# Patient Record
Sex: Female | Born: 1955
Health system: Southern US, Community
[De-identification: ages and names within clinical notes are randomized; demographics above are authoritative.]

## PROBLEM LIST (undated history)

## (undated) DIAGNOSIS — Z8632 Personal history of gestational diabetes: Secondary | ICD-10-CM

## (undated) DIAGNOSIS — Z9289 Personal history of other medical treatment: Secondary | ICD-10-CM

## (undated) DIAGNOSIS — M26629 Arthralgia of temporomandibular joint, unspecified side: Secondary | ICD-10-CM

## (undated) DIAGNOSIS — E041 Nontoxic single thyroid nodule: Secondary | ICD-10-CM

## (undated) DIAGNOSIS — S62102A Fracture of unspecified carpal bone, left wrist, initial encounter for closed fracture: Secondary | ICD-10-CM

## (undated) DIAGNOSIS — D241 Benign neoplasm of right breast: Secondary | ICD-10-CM

## (undated) DIAGNOSIS — O24419 Gestational diabetes mellitus in pregnancy, unspecified control: Secondary | ICD-10-CM

## (undated) DIAGNOSIS — F329 Major depressive disorder, single episode, unspecified: Secondary | ICD-10-CM

## (undated) DIAGNOSIS — E785 Hyperlipidemia, unspecified: Secondary | ICD-10-CM

## (undated) DIAGNOSIS — K219 Gastro-esophageal reflux disease without esophagitis: Secondary | ICD-10-CM

## (undated) DIAGNOSIS — R7303 Prediabetes: Secondary | ICD-10-CM

## (undated) DIAGNOSIS — F430 Acute stress reaction: Secondary | ICD-10-CM

## (undated) DIAGNOSIS — F32A Depression, unspecified: Secondary | ICD-10-CM

## (undated) DIAGNOSIS — I10 Essential (primary) hypertension: Secondary | ICD-10-CM

## (undated) DIAGNOSIS — Z780 Asymptomatic menopausal state: Secondary | ICD-10-CM

## (undated) HISTORY — DX: Asymptomatic menopausal state: Z78.0

## (undated) HISTORY — DX: Gestational diabetes mellitus in pregnancy, unspecified control: O24.419

## (undated) HISTORY — DX: Nontoxic single thyroid nodule: E04.1

## (undated) HISTORY — DX: Essential (primary) hypertension: I10

## (undated) HISTORY — DX: Acute stress reaction: F43.0

## (undated) HISTORY — DX: Benign neoplasm of right breast: D24.1

## (undated) HISTORY — PX: COLONOSCOPY: SHX174

## (undated) HISTORY — DX: Hyperlipidemia, unspecified: E78.5

## (undated) HISTORY — PX: OTHER SURGICAL HISTORY: SHX169

---
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
## 1979-03-09 HISTORY — PX: OTHER SURGICAL HISTORY: SHX169

## 1979-03-09 HISTORY — PX: MICROTUBOPLASTY: SHX5401

## 1985-03-08 HISTORY — PX: TUBAL LIGATION: SHX77

## 1985-03-08 HISTORY — PX: OTHER SURGICAL HISTORY: SHX169

## 1993-03-08 HISTORY — PX: BREAST EXCISIONAL BIOPSY: SUR124

## 1997-09-24 ENCOUNTER — Ambulatory Visit (HOSPITAL_COMMUNITY): Admission: RE | Admit: 1997-09-24 | Discharge: 1997-09-24 | Payer: Self-pay | Admitting: *Deleted

## 1998-09-10 ENCOUNTER — Ambulatory Visit (HOSPITAL_COMMUNITY): Admission: RE | Admit: 1998-09-10 | Discharge: 1998-09-10 | Payer: Self-pay | Admitting: Obstetrics and Gynecology

## 1998-09-10 ENCOUNTER — Encounter: Payer: Self-pay | Admitting: Obstetrics and Gynecology

## 1999-06-11 ENCOUNTER — Other Ambulatory Visit: Admission: RE | Admit: 1999-06-11 | Discharge: 1999-06-11 | Payer: Self-pay | Admitting: Obstetrics and Gynecology

## 1999-10-12 ENCOUNTER — Ambulatory Visit (HOSPITAL_COMMUNITY): Admission: RE | Admit: 1999-10-12 | Discharge: 1999-10-12 | Payer: Self-pay | Admitting: Obstetrics and Gynecology

## 1999-10-12 ENCOUNTER — Encounter: Payer: Self-pay | Admitting: Obstetrics and Gynecology

## 1999-10-28 ENCOUNTER — Encounter: Admission: RE | Admit: 1999-10-28 | Discharge: 1999-10-28 | Payer: Self-pay | Admitting: Family Medicine

## 1999-10-28 ENCOUNTER — Encounter: Payer: Self-pay | Admitting: Family Medicine

## 1999-11-04 ENCOUNTER — Encounter (INDEPENDENT_AMBULATORY_CARE_PROVIDER_SITE_OTHER): Payer: Self-pay | Admitting: *Deleted

## 1999-11-04 ENCOUNTER — Encounter: Payer: Self-pay | Admitting: Family Medicine

## 1999-11-04 ENCOUNTER — Ambulatory Visit (HOSPITAL_COMMUNITY): Admission: RE | Admit: 1999-11-04 | Discharge: 1999-11-04 | Payer: Self-pay | Admitting: Cardiology

## 2000-05-11 ENCOUNTER — Encounter: Payer: Self-pay | Admitting: Family Medicine

## 2000-05-11 ENCOUNTER — Encounter: Admission: RE | Admit: 2000-05-11 | Discharge: 2000-05-11 | Payer: Self-pay | Admitting: Family Medicine

## 2000-10-17 ENCOUNTER — Encounter: Payer: Self-pay | Admitting: Obstetrics and Gynecology

## 2000-10-17 ENCOUNTER — Ambulatory Visit (HOSPITAL_COMMUNITY): Admission: RE | Admit: 2000-10-17 | Discharge: 2000-10-17 | Payer: Self-pay | Admitting: Obstetrics and Gynecology

## 2001-06-05 ENCOUNTER — Encounter: Payer: Self-pay | Admitting: Family Medicine

## 2001-06-05 ENCOUNTER — Encounter: Admission: RE | Admit: 2001-06-05 | Discharge: 2001-06-05 | Payer: Self-pay | Admitting: Family Medicine

## 2001-12-06 ENCOUNTER — Ambulatory Visit (HOSPITAL_COMMUNITY): Admission: RE | Admit: 2001-12-06 | Discharge: 2001-12-06 | Payer: Self-pay | Admitting: Obstetrics and Gynecology

## 2001-12-06 ENCOUNTER — Encounter: Payer: Self-pay | Admitting: Obstetrics and Gynecology

## 2002-12-10 ENCOUNTER — Ambulatory Visit (HOSPITAL_COMMUNITY): Admission: RE | Admit: 2002-12-10 | Discharge: 2002-12-10 | Payer: Self-pay | Admitting: Obstetrics and Gynecology

## 2002-12-10 ENCOUNTER — Encounter: Payer: Self-pay | Admitting: Obstetrics and Gynecology

## 2003-12-19 ENCOUNTER — Ambulatory Visit (HOSPITAL_COMMUNITY): Admission: RE | Admit: 2003-12-19 | Discharge: 2003-12-19 | Payer: Self-pay | Admitting: Obstetrics and Gynecology

## 2004-10-06 ENCOUNTER — Other Ambulatory Visit: Admission: RE | Admit: 2004-10-06 | Discharge: 2004-10-06 | Payer: Self-pay | Admitting: Obstetrics and Gynecology

## 2004-10-09 ENCOUNTER — Encounter: Admission: RE | Admit: 2004-10-09 | Discharge: 2004-10-09 | Payer: Self-pay | Admitting: Obstetrics and Gynecology

## 2005-03-08 HISTORY — PX: COLONOSCOPY: SHX174

## 2005-08-12 ENCOUNTER — Other Ambulatory Visit: Admission: RE | Admit: 2005-08-12 | Discharge: 2005-08-12 | Payer: Self-pay | Admitting: *Deleted

## 2005-08-30 ENCOUNTER — Encounter: Admission: RE | Admit: 2005-08-30 | Discharge: 2005-08-30 | Payer: Self-pay | Admitting: Family Medicine

## 2005-10-12 ENCOUNTER — Encounter: Admission: RE | Admit: 2005-10-12 | Discharge: 2005-11-18 | Payer: Self-pay | Admitting: Occupational Medicine

## 2005-11-01 ENCOUNTER — Ambulatory Visit (HOSPITAL_COMMUNITY): Admission: RE | Admit: 2005-11-01 | Discharge: 2005-11-01 | Payer: Self-pay | Admitting: Gastroenterology

## 2005-12-13 ENCOUNTER — Encounter: Admission: RE | Admit: 2005-12-13 | Discharge: 2005-12-13 | Payer: Self-pay | Admitting: Family Medicine

## 2006-10-18 ENCOUNTER — Other Ambulatory Visit: Admission: RE | Admit: 2006-10-18 | Discharge: 2006-10-18 | Payer: Self-pay | Admitting: Family Medicine

## 2006-11-23 ENCOUNTER — Ambulatory Visit (HOSPITAL_COMMUNITY): Admission: RE | Admit: 2006-11-23 | Discharge: 2006-11-23 | Payer: Self-pay | Admitting: Interventional Cardiology

## 2006-12-16 ENCOUNTER — Ambulatory Visit (HOSPITAL_COMMUNITY): Admission: RE | Admit: 2006-12-16 | Discharge: 2006-12-16 | Payer: Self-pay | Admitting: Family Medicine

## 2007-01-09 ENCOUNTER — Emergency Department (HOSPITAL_COMMUNITY): Admission: EM | Admit: 2007-01-09 | Discharge: 2007-01-09 | Payer: Self-pay | Admitting: Emergency Medicine

## 2007-06-29 ENCOUNTER — Encounter: Admission: RE | Admit: 2007-06-29 | Discharge: 2007-06-29 | Payer: Self-pay | Admitting: Family Medicine

## 2007-07-07 ENCOUNTER — Ambulatory Visit (HOSPITAL_BASED_OUTPATIENT_CLINIC_OR_DEPARTMENT_OTHER): Admission: RE | Admit: 2007-07-07 | Discharge: 2007-07-07 | Payer: Self-pay | Admitting: Family Medicine

## 2007-07-15 ENCOUNTER — Ambulatory Visit: Payer: Self-pay | Admitting: Internal Medicine

## 2007-09-18 ENCOUNTER — Emergency Department (HOSPITAL_COMMUNITY): Admission: EM | Admit: 2007-09-18 | Discharge: 2007-09-18 | Payer: Self-pay | Admitting: Emergency Medicine

## 2007-12-18 ENCOUNTER — Ambulatory Visit (HOSPITAL_COMMUNITY): Admission: RE | Admit: 2007-12-18 | Discharge: 2007-12-18 | Payer: Self-pay | Admitting: Family Medicine

## 2008-02-12 ENCOUNTER — Emergency Department (HOSPITAL_COMMUNITY): Admission: EM | Admit: 2008-02-12 | Discharge: 2008-02-12 | Payer: Self-pay | Admitting: Family Medicine

## 2008-04-30 ENCOUNTER — Emergency Department (HOSPITAL_COMMUNITY): Admission: EM | Admit: 2008-04-30 | Discharge: 2008-04-30 | Payer: Self-pay | Admitting: Family Medicine

## 2008-10-21 ENCOUNTER — Emergency Department (HOSPITAL_COMMUNITY): Admission: EM | Admit: 2008-10-21 | Discharge: 2008-10-21 | Payer: Self-pay | Admitting: Family Medicine

## 2008-12-17 ENCOUNTER — Emergency Department (HOSPITAL_COMMUNITY): Admission: EM | Admit: 2008-12-17 | Discharge: 2008-12-17 | Payer: Self-pay | Admitting: Emergency Medicine

## 2008-12-27 ENCOUNTER — Other Ambulatory Visit: Admission: RE | Admit: 2008-12-27 | Discharge: 2008-12-27 | Payer: Self-pay | Admitting: Family Medicine

## 2009-08-13 ENCOUNTER — Encounter: Admission: RE | Admit: 2009-08-13 | Discharge: 2009-08-13 | Payer: Self-pay | Admitting: Family Medicine

## 2010-01-21 ENCOUNTER — Ambulatory Visit (HOSPITAL_COMMUNITY): Admission: RE | Admit: 2010-01-21 | Discharge: 2010-01-21 | Payer: Self-pay | Admitting: Family Medicine

## 2010-03-29 ENCOUNTER — Encounter: Payer: Self-pay | Admitting: Family Medicine

## 2010-06-23 LAB — POCT URINALYSIS DIP (DEVICE)
Nitrite: NEGATIVE
Urobilinogen, UA: 0.2 mg/dL (ref 0.0–1.0)
pH: 7 (ref 5.0–8.0)

## 2010-07-24 NOTE — Op Note (Signed)
NAMEBREDA, Lori Walsh                ACCOUNT NO.:  [id number]   MEDICAL RECORD NO.:  [id number]          PATIENT TYPE:  AMB   LOCATION:  ENDO                         FACILITY:  [facility]   PHYSICIAN:  Shirley Friar, MDDATE OF BIRTH:  1955-09-08   DATE OF PROCEDURE:  DATE OF DISCHARGE:                                 OPERATIVE REPORT   PROCEDURE:  Colonoscopy.   INDICATION:  Screening (family history of colon polyps).   FINDINGS:  Rectal exam was normal.  A pediatric adjustable colonoscope was  inserted into an adequately prepped colon and advanced to the cecum where  the ileocecal valve and appendiceal orifice were identified.  On careful  withdrawal, the colonoscope revealed no mucosal abnormalities.  Retroflexion  was unremarkable.   ASSESSMENT:  Normal colonoscopy.   PLAN:  Repeat colonoscopy in 5 years due to family history of polyps.      Shirley Friar, MD  Electronically Signed     VCS/MEDQ  D:  11/01/2005  T:  11/01/2005  Job:  [id number]   cc:   Caryn Bee L. Little, M.D.

## 2010-07-24 NOTE — Procedures (Signed)
NAME:  Lori Walsh, Lori Walsh                ACCOUNT NO.:  [id number]   MEDICAL RECORD NO.:  [id number]          PATIENT TYPE:  OUT   LOCATION:  SLEEP CENTER                 FACILITY:  [facility]   PHYSICIAN:  Clinton D. Maple Hudson, MD, FCCP, FACPDATE OF BIRTH:  01[birth year]   DATE OF STUDY:                            NOCTURNAL POLYSOMNOGRAM   REFERRING PHYSICIAN:  Chales Salmon. Abigail Miyamoto, M.D.   INDICATION FOR STUDY:  Hypersomnia with sleep apnea.   EPWORTH SLEEPINESS SCORE:  10/24, BMI 29, weight 169 pounds, height 64  inches, neck 14.5 inches.   MEDICATIONS:  Home medications charted and reviewed.   SLEEP ARCHITECTURE:  Total sleep time 363 minutes with sleep efficiency  93%.  Stage I was 5.9%.  Stage II 70.4%.  Stage III 2.2%.  REM 21.5% of  total sleep time.  Sleep latency 10.5 minutes.  REM latency 158 minutes.  Awake after sleep onset 14.5 minutes.  Arousal index 10.2.  Tylenol PM  was taken at 9:45 p.m.   RESPIRATORY DATA:  Apnea/hypopnea index (AHI) 5.8 per hour indicating  minimal obstructive sleep apnea/hypopnea syndrome, 35 events were  counted including 2 obstructive apneas and 33 hypopneas.  Events were  not positional.  REM AHI 10 per hour.  There were insufficient events to  qualify for CPAP titration by split protocol on this study night.   OXYGEN DATA:  Loud snoring with oxygen desaturation to a nadir of 89%.  Mean oxygen saturation through the study 96.8% on room air.   CARDIAC DATA:  Normal sinus rhythm.   MOVEMENT-PARASOMNIA:  No significant movement disturbance.  No bathroom  trips.   IMPRESSIONS-RECOMMENDATIONS:  1. Minimal obstructive sleep apnea/hypopnea syndrome, AHI 5.8 per hour      (normal range 0-5 per hour).  Nonpositional events with very loud      snoring and oxygen desaturation to a nadir of 89%.  2. There were insufficient events to qualify for continuous positive      airway pressure titration by split protocol on      this study night.  Conservative therapy may  include weight loss,      encouragement to sleep off flat of back, treatment for any      significant nasal or upper airway obstruction and possibly an oral      appliance.      Clinton D. Maple Hudson, MD, [facility], FACP  Diplomate, Biomedical engineer of Sleep Medicine  Electronically Signed     CDY/MEDQ  D:  07/15/2007 09:01:35  T:  07/15/2007 09:21:50  Job:  [id number]

## 2010-09-25 ENCOUNTER — Other Ambulatory Visit (HOSPITAL_COMMUNITY)
Admission: RE | Admit: 2010-09-25 | Discharge: 2010-09-25 | Disposition: A | Discharge status: Home / Self Care | Payer: 59 | Source: Ambulatory Visit | Attending: Family Medicine | Admitting: Family Medicine

## 2010-09-25 ENCOUNTER — Other Ambulatory Visit: Payer: Self-pay | Admitting: Family Medicine

## 2010-09-25 DIAGNOSIS — Z124 Encounter for screening for malignant neoplasm of cervix: Secondary | ICD-10-CM | POA: Insufficient documentation

## 2010-09-25 DIAGNOSIS — Z1159 Encounter for screening for other viral diseases: Secondary | ICD-10-CM | POA: Insufficient documentation

## 2010-12-15 LAB — POCT URINALYSIS DIP (DEVICE)
Nitrite: POSITIVE — AB
Urobilinogen, UA: 0.2
pH: 6

## 2011-02-08 ENCOUNTER — Other Ambulatory Visit: Payer: Self-pay | Admitting: Gastroenterology

## 2011-02-23 ENCOUNTER — Other Ambulatory Visit (HOSPITAL_COMMUNITY): Payer: Self-pay | Admitting: Obstetrics & Gynecology

## 2011-02-23 DIAGNOSIS — Z1231 Encounter for screening mammogram for malignant neoplasm of breast: Secondary | ICD-10-CM

## 2011-03-29 ENCOUNTER — Ambulatory Visit (HOSPITAL_COMMUNITY): Payer: 59 | Attending: Obstetrics & Gynecology

## 2011-04-05 ENCOUNTER — Other Ambulatory Visit: Payer: Self-pay | Admitting: Family Medicine

## 2011-04-05 DIAGNOSIS — E041 Nontoxic single thyroid nodule: Secondary | ICD-10-CM

## 2011-08-16 ENCOUNTER — Ambulatory Visit
Admission: RE | Admit: 2011-08-16 | Discharge: 2011-08-16 | Disposition: A | Discharge status: Home / Self Care | Payer: 59 | Source: Ambulatory Visit | Attending: Family Medicine | Admitting: Family Medicine

## 2011-08-16 DIAGNOSIS — E041 Nontoxic single thyroid nodule: Secondary | ICD-10-CM

## 2012-03-09 ENCOUNTER — Ambulatory Visit (HOSPITAL_COMMUNITY): Payer: 59 | Attending: Obstetrics & Gynecology

## 2012-03-13 ENCOUNTER — Other Ambulatory Visit (HOSPITAL_COMMUNITY): Payer: Self-pay | Admitting: Family Medicine

## 2012-03-13 DIAGNOSIS — Z1231 Encounter for screening mammogram for malignant neoplasm of breast: Secondary | ICD-10-CM

## 2012-03-15 ENCOUNTER — Ambulatory Visit (HOSPITAL_COMMUNITY)
Admission: RE | Admit: 2012-03-15 | Discharge: 2012-03-15 | Disposition: A | Discharge status: Home / Self Care | Payer: 59 | Source: Ambulatory Visit | Attending: Family Medicine | Admitting: Family Medicine

## 2012-03-15 DIAGNOSIS — Z1231 Encounter for screening mammogram for malignant neoplasm of breast: Secondary | ICD-10-CM | POA: Insufficient documentation

## 2012-07-12 ENCOUNTER — Ambulatory Visit (INDEPENDENT_AMBULATORY_CARE_PROVIDER_SITE_OTHER): Payer: Self-pay | Admitting: Family Medicine

## 2012-07-12 VITALS — BP 128/78 | Wt 187.0 lb

## 2012-07-12 DIAGNOSIS — R7303 Prediabetes: Secondary | ICD-10-CM

## 2012-07-12 DIAGNOSIS — R7309 Other abnormal glucose: Secondary | ICD-10-CM

## 2012-07-13 DIAGNOSIS — E119 Type 2 diabetes mellitus without complications: Secondary | ICD-10-CM | POA: Insufficient documentation

## 2012-07-13 DIAGNOSIS — R7303 Prediabetes: Secondary | ICD-10-CM | POA: Insufficient documentation

## 2012-07-13 NOTE — Progress Notes (Signed)
Patient presents today for 3 month pre-diabetes follow-up as part of the employer-sponsored Link to Wellness program. Patient is not currently treated for diabetes. Patient also continues on daily ASA and ACEi. Most recent MD follow-up was this past February. Patient will follow-up again with MD in 6 months. No med changes or major health changes at this time.   Diabetes Assessment: Type of Diabetes: Pre-Diabetes; MD managing Diabetes [place], eagle at [employer]; Sees Diabetes provider 2 times per year; checks blood glucose 1-2 times a week; takes medications as prescribed; checks feet weekly; takes an aspirin a day; uses glucometer; hypoglycemia frequency None; Highest CBG 124; A1c - 6.0 via point-of-care.  Other Diabetes History: Patient is remains pre-diabetic and is not currently being treated. She continues testing 1-2 times per week with no hypoglycemic episodes. Patient did not bring meter today but per patient recall highest readings has been 124. Patient is up-to-date on eye exam and denies any issues with feet.   Lifestyle Factors: Exercise - No routine exercise at this time. Patient is attempting to walk 1-2 times per week on lunch breaks and continues walking dog nearly daily. Has not been to the gym recently but still has a Research scientist (physical sciences). Limiting factor at this time is stress and fatigue. Patient is aware that exercise will help relieve stress and improve mood, but is struggling with motivation at this time. She is currently under a very stressful family situation, given that she and her husband have opened their home to other family members.   Diet - Patient admits that diet has deteriorated due to stress eating. She is aware her portions have increased and she is eating more icecream and comfort foods. As a result she has gained 10 lbs and recognizes the importance of getting back on track.  Assessment: Patient presents today with slightly elevated A1c of 6.0, previously 5.7. This is  likely due to dietary deterioration, stress, and weight gain of 10 lbs. Patient is aware of the need to improve and will work on lifestyle changes in the coming months. She will follow-up in 3 months.  Plan: 1) Continue testing regularly 2) Attempt to increase exercise as tolerated and as time allows 3) Attempt to get back on track with diet, limiting portion sizes and sweets 4) Follow-up in 3 months on Wednesday August 6th @ 11:00 am

## 2012-07-20 NOTE — Progress Notes (Signed)
Patient ID: SRISHTI STRNAD, female   DOB: May 13, 1955, 57 y.o.   MRN: [id number] ATTENDING PHYSICIAN NOTE: I have reviewed the chart and agree with the plan as detailed above. Denny Levy MD Pager [phone]

## 2012-10-09 ENCOUNTER — Ambulatory Visit: Payer: 59 | Attending: Orthopedic Surgery | Admitting: Physical Therapy

## 2012-10-09 DIAGNOSIS — M25619 Stiffness of unspecified shoulder, not elsewhere classified: Secondary | ICD-10-CM | POA: Insufficient documentation

## 2012-10-09 DIAGNOSIS — R293 Abnormal posture: Secondary | ICD-10-CM | POA: Insufficient documentation

## 2012-10-09 DIAGNOSIS — IMO0001 Reserved for inherently not codable concepts without codable children: Secondary | ICD-10-CM | POA: Insufficient documentation

## 2012-10-09 DIAGNOSIS — M25519 Pain in unspecified shoulder: Secondary | ICD-10-CM | POA: Insufficient documentation

## 2012-10-11 ENCOUNTER — Ambulatory Visit: Payer: 59 | Admitting: Physical Therapy

## 2012-10-17 ENCOUNTER — Ambulatory Visit: Payer: 59 | Admitting: Rehabilitation

## 2012-10-19 ENCOUNTER — Ambulatory Visit: Payer: 59 | Admitting: Rehabilitation

## 2012-10-24 ENCOUNTER — Ambulatory Visit: Payer: 59 | Admitting: Physical Therapy

## 2012-10-26 ENCOUNTER — Ambulatory Visit: Payer: 59 | Admitting: Rehabilitation

## 2012-10-30 ENCOUNTER — Ambulatory Visit: Payer: 59 | Admitting: Physical Therapy

## 2012-11-01 ENCOUNTER — Ambulatory Visit: Payer: 59 | Admitting: Physical Therapy

## 2012-11-07 ENCOUNTER — Encounter: Payer: 59 | Admitting: Physical Therapy

## 2012-11-08 ENCOUNTER — Ambulatory Visit (INDEPENDENT_AMBULATORY_CARE_PROVIDER_SITE_OTHER): Payer: Self-pay | Admitting: Family Medicine

## 2012-11-08 ENCOUNTER — Ambulatory Visit: Payer: 59 | Admitting: Rehabilitation

## 2012-11-08 VITALS — BP 132/78 | Wt 186.0 lb

## 2012-11-08 DIAGNOSIS — R7309 Other abnormal glucose: Secondary | ICD-10-CM

## 2012-11-08 DIAGNOSIS — R7303 Prediabetes: Secondary | ICD-10-CM

## 2012-11-08 NOTE — Progress Notes (Signed)
Patient presents today for 3 month pre-diabetes follow-up as part of the employer-sponsored Link to Wellness program. Patient is not currently being treated for pre-diabets. She does continue on daily ASA and ACEi. Patient has not seen MD recently and has a follow-up yearly unless otherwise needed. No med changes or major health changes at this time. She is on high dose IBU temporarily for heel spur.   Diabetes Assessment: Type of Diabetes: Pre-Diabetes; MD managing Diabetes [place], eagle at [employer]; Sees Diabetes provider 2 times per year; checks blood glucose 1-2 times a week; takes medications as prescribed; takes an aspirin a day; uses glucometer; hypoglycemia frequency None; Highest CBG 124; A1c 5.8 via point-of-care in pharmacy Other Diabetes History: Patient is remains pre-diabetic and is not currently being treated. She continues testing 1-2 times per week with no hypoglycemic episodes. Patient did not bring meter today but per patient recall highest readings has been 124, random, and most fasting readings ranging 105-110s. Patient is up-to-date on eye exam and denies any issues with feet.   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 The club meets once weekly and walks for ~40 min. In addition to this, patient walks one day per week for 30 minutes. This is an improvement from last follow-up as patient was walking only on occassion during her lunch break.  Diet - Patient admits that she continues struggling with diet. She continues to be in a somewhat stressful home environment with multiple family members having moved in with her. Her daughter-in-law now cooks for her, which is very kind, but patient reports her cooking habits are less than optimal, with macaroni and cheese, potatoes, or corn on the cob nearly every night. She also continues eating comfort foods such as icecream on a regular basis. She will attempt to fix  herself a salad as a substitute for macaroni and cheese every night.   Assessment: Patient presents today with slightly elevated A1c of 5.8, previously 5.7. This is likely due to continued dietary deterioration. She has not lost weight at this visit, but will work on this over the next three months. Patient is aware of the need to improve and will work on lifestyle changes in the coming months. At this time we will focus on imrpoving dietary choices and continuing with twice per week exercise regimen.   Plan: 1) Focus on diet over the next three months 2) Attempt to limit starchy carbs, and portion sizes 3) Continue in walking school, great job with this 4) Return for follow-up in 3 mo follow-up on Wednesday Dec 3rd @ 1:30 pm

## 2012-11-09 ENCOUNTER — Ambulatory Visit: Payer: 59 | Attending: Orthopedic Surgery | Admitting: Rehabilitation

## 2012-11-09 ENCOUNTER — Ambulatory Visit: Payer: 59 | Admitting: Rehabilitation

## 2012-11-09 DIAGNOSIS — IMO0001 Reserved for inherently not codable concepts without codable children: Secondary | ICD-10-CM | POA: Insufficient documentation

## 2012-11-09 DIAGNOSIS — M25619 Stiffness of unspecified shoulder, not elsewhere classified: Secondary | ICD-10-CM | POA: Insufficient documentation

## 2012-11-09 DIAGNOSIS — R293 Abnormal posture: Secondary | ICD-10-CM | POA: Insufficient documentation

## 2012-11-09 DIAGNOSIS — M25519 Pain in unspecified shoulder: Secondary | ICD-10-CM | POA: Insufficient documentation

## 2012-11-14 ENCOUNTER — Ambulatory Visit: Payer: 59 | Admitting: Physical Therapy

## 2012-11-17 ENCOUNTER — Ambulatory Visit: Payer: 59 | Admitting: Rehabilitation

## 2013-04-04 ENCOUNTER — Ambulatory Visit (INDEPENDENT_AMBULATORY_CARE_PROVIDER_SITE_OTHER): Payer: Self-pay | Admitting: Family Medicine

## 2013-04-04 VITALS — BP 128/78 | Wt 182.0 lb

## 2013-04-04 DIAGNOSIS — R7303 Prediabetes: Secondary | ICD-10-CM

## 2013-04-04 DIAGNOSIS — R7309 Other abnormal glucose: Secondary | ICD-10-CM

## 2013-04-10 NOTE — Progress Notes (Signed)
Patient presents today for 3 month pre-diabetes follow-up as part of the employer-sponsored Link to Wellness program. Patient is not currently being treated for pre-diabetes. Patient continues on daily ASA, ACEi. She is not on a statin but does take Red yeast rice (her choice). Most recent MD follow-up was Oct 2014. Patient has a pending appt for February for routine physical and labwork. No med changes or major health changes at this time.  Diabetes Assessment: Type of Diabetes: Pre-Diabetes; MD managing Diabetes [place], eagle at [employer]; Sees Diabetes provider 2 times per year; checks blood glucose 1-2 times a week; takes medications as prescribed; checks feet weekly; takes an aspirin a day; uses glucometer; hypoglycemia frequency None; 7 day CBG average 106; 30 day CBG average 118; 14 day CBG average 117; Lowest CBG 90; Highest CBG 154; A1c 5.7 Other Diabetes History: Patient is remains pre-diabetic and is not currently being treated. She continues testing 1-2 times per week with no hypoglycemic episodes. Patient did bring meter today and glucose is generally between 90-120s with lowest reading of 90 and highest reading of 154. Patient is due for eye exam and will schedule appt soon. Denies any issues with feet.   Lifestyle Factors: Exercise - Patient is exercising but not as routinely as she would like. She is walking around the hospital for ~15 minutes during her lunch break. She also occassionally walks in the morning depending on weather. She and her husband now have a gym membership and she attends approximately once weekly for 60 minutes, but would like to increase to twice weekly. While at the gym patient uses stationary bike and does 30 minutes of circuit training exercises.  Diet - Patient reports an improved diet, as her daughter-in-law is no longer in charge of the cooking, as a result they are making better choices. Patient does admit to continued snacking at night after supper,  specifically on icecream. However, she has attempted to making improvements in this area with regard to portion sizes and frequency of snacking. Patient has a weight loss goal of 15 lbs over next three months.   Assessment: Patient presents today with at goal A1c of 5.7. Patient has made some improvement to diet and hopes to continue to increase exercise. She has set a goal to lose 15 lbs over the next three months. Patient is aware of the need to improve in order to meet this goal and will work on lifestyle changes, specifically exercise, in the coming months. Patient will follow-up in 3 months.  Plan: 1) Increase exercise, attempt to go to gym twice per week along with other exercising 2) Increase vegetables by adding a salad for lunch once per week 3) Weight loss goal of 15 lbs over next three months 4) Schedule an eye exam and mammogram 5) Follow-up in 3 months

## 2013-05-07 ENCOUNTER — Other Ambulatory Visit (HOSPITAL_COMMUNITY): Payer: Self-pay | Admitting: Family Medicine

## 2013-05-07 DIAGNOSIS — Z1231 Encounter for screening mammogram for malignant neoplasm of breast: Secondary | ICD-10-CM

## 2013-05-10 ENCOUNTER — Ambulatory Visit (HOSPITAL_COMMUNITY): Payer: 59

## 2013-05-21 ENCOUNTER — Ambulatory Visit (HOSPITAL_COMMUNITY)
Admission: RE | Admit: 2013-05-21 | Discharge: 2013-05-21 | Disposition: A | Discharge status: Home / Self Care | Payer: 59 | Source: Ambulatory Visit | Attending: Family Medicine | Admitting: Family Medicine

## 2013-05-21 DIAGNOSIS — Z1231 Encounter for screening mammogram for malignant neoplasm of breast: Secondary | ICD-10-CM | POA: Insufficient documentation

## 2013-07-03 ENCOUNTER — Other Ambulatory Visit (HOSPITAL_COMMUNITY)
Admission: RE | Admit: 2013-07-03 | Discharge: 2013-07-03 | Disposition: A | Discharge status: Home / Self Care | Payer: 59 | Source: Ambulatory Visit | Attending: Family Medicine | Admitting: Family Medicine

## 2013-07-03 ENCOUNTER — Other Ambulatory Visit: Payer: Self-pay | Admitting: Family Medicine

## 2013-07-03 DIAGNOSIS — Z1151 Encounter for screening for human papillomavirus (HPV): Secondary | ICD-10-CM | POA: Insufficient documentation

## 2013-07-03 DIAGNOSIS — Z124 Encounter for screening for malignant neoplasm of cervix: Secondary | ICD-10-CM | POA: Insufficient documentation

## 2013-07-04 ENCOUNTER — Other Ambulatory Visit: Payer: Self-pay | Admitting: Family Medicine

## 2013-07-04 ENCOUNTER — Other Ambulatory Visit (HOSPITAL_COMMUNITY): Payer: Self-pay | Admitting: Family Medicine

## 2013-07-04 DIAGNOSIS — E041 Nontoxic single thyroid nodule: Secondary | ICD-10-CM

## 2013-07-24 ENCOUNTER — Ambulatory Visit (INDEPENDENT_AMBULATORY_CARE_PROVIDER_SITE_OTHER): Payer: Self-pay | Admitting: Family Medicine

## 2013-07-24 VITALS — BP 132/70 | Wt 178.0 lb

## 2013-07-24 DIAGNOSIS — E119 Type 2 diabetes mellitus without complications: Secondary | ICD-10-CM

## 2013-07-24 NOTE — Progress Notes (Signed)
Patient presents today for 3 month pre-diabetes follow-up as part of the employer-sponsored Link to Wellness program. Pt is not currently being treated for diabetes. Patient also continues on daily ASA, ACEi, and red yeast rice. Most recent MD follow-up was April 2014. Patient has a pending appt for 6 mofollow-up. No med changes or major health changes at this time.  Diabetes Assessment: Type of Diabetes: Pre-Diabetes; MD managing Diabetes [place], eagle at [employer]; Sees Diabetes provider 2 times per year; checks blood glucose 1-2 times a week; takes medications as prescribed; checks feet weekly; takes an aspirin a day; uses glucometer; hypoglycemia frequency None; Highest CBG 105; Lowest CBG 93; A1c 5.9 (prev 5.7) Other Diabetes History: Patient is remains pre-diabetic and is not currently being treated. She continues testing 1-2 times per week with no hypoglycemic episodes. Patient did not bring meter today but per pt report, glucose with lowest reading of 93 and highest reading of 105 . Patient is up to date on eye exam. Denies any issues with feet or neuropathy.   Lifestyle Factors: Exercise -Patient continues exercising regularly. She continues walking around the hospital for ~15 minutes during her lunch break. She also occassionally walks her dog in the morning depending on weather. She and her husband still have a gym membership and she now participates in the "Diabetes Prevention" program through [facility]. [facility] employees receive a discounted rate. The program serves as an exercise program (weekly x 16 weeks) as well as a support group who discusses diet, weight loss, etc.  Diet - No major changes to diet, pateint continues attempting to make healthy choices and manage portion sizes. She is trying to avoid purchasing snack items, but does admit to continuing to eat icecream regularly. Patient has a continued weight loss goal of 15 lbs over next three months, so far she has lost 4 lbs toward this  goal.   Assessment: Patient presents today with slightly elevated A1c of 5.9 (prev 5.7). Patient continues to make some improvement to diet and hopes to continue to increase exercise. She has set a goal to lose 15 lbs over the next three months. Patient is aware of the need to improve in order to meet this goal and will work on lifestyle changes, specifically exercise, in the coming months. Patient will follow-up in 3 months.  Plan: 1) Continue making healthy dietary choices 2) Continue exercising regularly, goal of 150 min/week, with weight loss goal of 15 lb total 3) Continue testing regularly 4) Return for follow-up in 3 months on Wednesday, August 19th @ 11:00

## 2013-08-06 ENCOUNTER — Ambulatory Visit (HOSPITAL_COMMUNITY)
Admission: RE | Admit: 2013-08-06 | Discharge: 2013-08-06 | Disposition: A | Discharge status: Home / Self Care | Payer: 59 | Source: Ambulatory Visit | Attending: Family Medicine | Admitting: Family Medicine

## 2013-08-06 DIAGNOSIS — E041 Nontoxic single thyroid nodule: Secondary | ICD-10-CM | POA: Insufficient documentation

## 2013-08-07 ENCOUNTER — Other Ambulatory Visit (HOSPITAL_COMMUNITY): Payer: Self-pay | Admitting: Family Medicine

## 2013-08-07 DIAGNOSIS — E041 Nontoxic single thyroid nodule: Secondary | ICD-10-CM

## 2013-08-10 ENCOUNTER — Encounter (HOSPITAL_COMMUNITY): Payer: Self-pay | Admitting: Pharmacy Technician

## 2013-08-14 ENCOUNTER — Ambulatory Visit (HOSPITAL_COMMUNITY)
Admission: RE | Admit: 2013-08-14 | Discharge: 2013-08-14 | Disposition: A | Discharge status: Home / Self Care | Payer: 59 | Source: Ambulatory Visit | Attending: Family Medicine | Admitting: Family Medicine

## 2013-08-14 DIAGNOSIS — E041 Nontoxic single thyroid nodule: Secondary | ICD-10-CM | POA: Insufficient documentation

## 2013-08-14 NOTE — Procedures (Signed)
US thyroid nodule (L) Enlarging nodule Left lower pole  25 g x 4  Pt tolerated well Path pending

## 2013-08-29 ENCOUNTER — Encounter: Payer: Self-pay | Admitting: Family Medicine

## 2013-10-24 ENCOUNTER — Ambulatory Visit (INDEPENDENT_AMBULATORY_CARE_PROVIDER_SITE_OTHER): Payer: Self-pay | Admitting: Family Medicine

## 2013-10-24 VITALS — BP 132/78 | Wt 177.0 lb

## 2013-10-24 DIAGNOSIS — R7309 Other abnormal glucose: Secondary | ICD-10-CM

## 2013-10-24 DIAGNOSIS — R7303 Prediabetes: Secondary | ICD-10-CM

## 2013-10-25 NOTE — Progress Notes (Signed)
Patient presents today for 3 month pre-diabetes follow-up as part of the employer-sponsored Link to Wellness program. Pt is not currently being treated for diabetes. Patient also continues on daily ASA, ACEi, and red yeast rice. Most recent MD follow-up was April 2014. Patient has a pending appt for 6 mo follow-up. No med changes or major health changes at this time. Of note, patient has been followed closely for several years for thyroid nodules. She recently had thyroid scanned and aspirated and will see an endocrinologist in October for evaluation of new nodule.  Diabetes Assessment: Type of Diabetes: Pre-Diabetes; MD managing Diabetes [place], eagle at [employer]; Sees Diabetes provider 2 times per year; checks blood glucose 1-2 times a week; takes medications as prescribed; checks feet weekly; takes an aspirin a day; uses glucometer; hypoglycemia frequency None; 14 day CBG average 122; 30 day CBG average 114; 7 day CBG average 127; Highest CBG 167; Lowest CBG 91 Other Diabetes History: Patient is remains pre-diabetic and is not currently being treated. She continues testing 1-2 times per week with no hypoglycemic episodes. Patient did bring meter today and glucose readings range 90-160, with most readings <150. Readings are random as patient has a hard time remembering to test fasting. Patient is up to date on eye exam. Denies any issues with feet or neuropathy.   Lifestyle Factors: Exercise - Patient continues participating in [facility] Diabetes Prevention program and is now in the maintenance phase which includes a once monthly support group meeting. She is also walking 3-4 times per week and is now up to 45 minutes each time. She walks with her daughter and takes her dog along. I am hopeful she can continue this throughout the fall. Patient will also be participating in dance soon and will begin weekly dance lessons this saturday, with a focus on core dances to strengthen core muscles.  Diet - No  major changes to diet, pateint continues attempting to make healthy choices and manage portion sizes. She is trying to avoid purchasing snack items, but does admit to continuing to eat icecream regularly. Patient does wish to continue losing weight but states this has been difficult even with increased exercise.   Assessment: Patient presents today with slightly improved A1c of 5.8 (prev 5.9). Patient continues to make some improvement to diet and hopes to continue to increase exercise. Patient is aware of the need to work on lifestyle changes in the coming months. Patient will follow-up in 3 months.  Plan: 1) Continue to make healthy dietary choices 2) Continue to exercise daily, great job with improvements in this area! 3) Continue to test regularly 4) Follow-up in 3 months on Wed Nov 25th @ 10:00 am

## 2013-11-08 ENCOUNTER — Encounter: Payer: Self-pay | Admitting: Family Medicine

## 2013-11-08 NOTE — Progress Notes (Signed)
Patient ID: Lori Walsh, female   DOB: 11/24/1955, 58 y.o.   MRN: [id number] Reviewed: Agree with the documentation and management of our [facility] Pharmacologist. 

## 2013-11-22 ENCOUNTER — Encounter: Payer: Self-pay | Admitting: Family Medicine

## 2013-11-22 NOTE — Progress Notes (Signed)
Patient ID: Lori Walsh, female   DOB: 06-Jun-1955, 58 y.o.   MRN: [id number] Reviewed: Agree with the documentation and management of our [facility] pharmacologist.

## 2013-11-22 NOTE — Progress Notes (Signed)
Patient ID: Lori Walsh, female   DOB: 12/05/1955, 58 y.o.   MRN: [id number] Reviewed: Agree with the documentation and management of our [facility] pharmacologist.

## 2013-11-22 NOTE — Assessment & Plan Note (Signed)
See progress notes

## 2014-01-03 ENCOUNTER — Encounter: Payer: Self-pay | Admitting: Family Medicine

## 2014-01-03 NOTE — Progress Notes (Signed)
Patient ID: Lori Walsh, female   DOB: 09/04/1955, 58 y.o.   MRN: [id number] Reviewed: Agree with the documentation and management of our [place] Pharmacologist. 

## 2014-02-06 ENCOUNTER — Ambulatory Visit (INDEPENDENT_AMBULATORY_CARE_PROVIDER_SITE_OTHER): Payer: Self-pay | Admitting: Family Medicine

## 2014-02-06 VITALS — BP 118/70 | Wt 179.0 lb

## 2014-02-06 DIAGNOSIS — R7309 Other abnormal glucose: Secondary | ICD-10-CM

## 2014-02-06 DIAGNOSIS — R7303 Prediabetes: Secondary | ICD-10-CM

## 2014-02-06 NOTE — Progress Notes (Signed)
Patient presents today for 3 month pre-diabetes follow-up as part of the employer-sponsored Link to Wellness program. Pt is not currently being treated for diabetes. Patient also continues on daily ASA, ACEi, and red yeast rice. Most recent MD follow-up was Sept 2014. Patient has a pending appt for 3 mo follow-up. No med changes or major health changes at this time.  Diabetes Assessment: Type of Diabetes: Pre-Diabetes; MD managing Diabetes Lori Walsh, eagle at [employer]; Sees Diabetes provider 2 times per year; checks blood glucose 1-2 times a week; takes medications as prescribed; checks feet weekly; takes an aspirin a day; uses glucometer; 30 day CBG average 120; 14 day CBG average 123; 7 day CBG average 132; hypoglycemia frequency None; Lowest CBG 101; Highest CBG 143; Other Diabetes History: Patient is remains pre-diabetic and is not currently being treated. She continues testing 1-2 times per week with no hypoglycemic episodes. Patient did bring meter today and glucose readings range 100-120s with only occasional readings >120. Readings are random as patient has a hard time remembering to test fasting. Patient is up to date on eye exam. Denies any issues with feet or neuropathy.   Lifestyle Factors: Exercise - No routine exercise, patient has reduced exercise due to rainy cooler weather and fewer hours of daylight. She has completed the [facility] Diabetes Prevention program. She has also discontinued dance lessons as these became too intense. In the past pt has enjoyed walking 3-4 times per week but is not doing that at this time. Patient's husband goes to [facility] 3 times per week and patient also has a membership there. She will try to go to the gym with her husband once per week to start.  Diet - No major changes to diet, pateint continues attempting to make healthy choices and manage portion sizes. She admits to over eating around the holidays but will work to maintain current weight throughout  the holidays.   Plan: 1) Continue to make healthy dietary choices 2) Attempt to go to gym once per week with goal to maintain weight over the holidays 3) Continue to test regularly 4) Follow-up in 3 months on Wed March 2nd @ 11:00 am

## 2014-03-21 ENCOUNTER — Encounter: Payer: Self-pay | Admitting: Family Medicine

## 2014-03-21 NOTE — Progress Notes (Signed)
Patient ID: Lori Walsh, female   DOB: 03-04-1956, 59 y.o.   MRN: [id number] Reviewed: Agree with the documentation and management of our [place].

## 2014-04-11 ENCOUNTER — Ambulatory Visit (INDEPENDENT_AMBULATORY_CARE_PROVIDER_SITE_OTHER): Payer: 59 | Admitting: Cardiology

## 2014-04-11 ENCOUNTER — Ambulatory Visit: Payer: 59 | Admitting: Cardiology

## 2014-04-11 ENCOUNTER — Encounter: Payer: Self-pay | Admitting: Cardiology

## 2014-04-11 VITALS — BP 136/64 | HR 82 | Ht 64.0 in | Wt 186.0 lb

## 2014-04-11 DIAGNOSIS — R7303 Prediabetes: Secondary | ICD-10-CM

## 2014-04-11 DIAGNOSIS — R0789 Other chest pain: Secondary | ICD-10-CM

## 2014-04-11 DIAGNOSIS — R079 Chest pain, unspecified: Secondary | ICD-10-CM

## 2014-04-11 DIAGNOSIS — R7309 Other abnormal glucose: Secondary | ICD-10-CM

## 2014-04-11 DIAGNOSIS — I10 Essential (primary) hypertension: Secondary | ICD-10-CM

## 2014-04-11 DIAGNOSIS — Z8249 Family history of ischemic heart disease and other diseases of the circulatory system: Secondary | ICD-10-CM | POA: Insufficient documentation

## 2014-04-11 NOTE — Progress Notes (Signed)
Lori Walsh Date of Birth:  1955/10/20 [facility] [address] [place], [place]  [address] [phone]        Fax   [phone]   History of Present Illness: This pleasant 59 year old woman is seen by me for the first time today.  She is seen at the request of Dr.Nnodi for evaluation of chest pain and shortness of breath.  The patient does not have any history known coronary disease.  In 2008 she had a Myoview stress test at [facility] which showed a questionable anterior wall scar and possible reversible ischemia.  She states that there was a question of breast tissue artifact and she was given the option to pursue the finding with further studies or to just watch it and she elected to just watch it.  She had done well over the ensuing years until approximately 2-3 months ago when she began noticing some intermittent discomfort in the substernal area under the left breast and sometimes in the left lateral chest.  The discomfort was not associated with activity and there was no radiation down her left arm or to her back or neck the patient has also noted more exertional dyspnea over the past months.  She does try to get regular walking exercise.  She takes the stairs at work rather than the [employer] pl[employer]. The patient has a history of hypertension for the past 10 years.  She is a borderline diabetic.  She has had problems with weight gain.  She is a nonsmoker.  She does have a history of high cholesterol and she is intolerant to Crestor, Lipitor, and simvastatin which caused muscle pain. Her family history reveals that her father underwent coronary artery bypass graft surgery in his 35s.  He died at 97 of complications of parkinsonism. The patient's mother died at age 56 of a ruptured intracerebral aneurysm.  Current Outpatient Prescriptions  Medication Sig Dispense Refill  . acyclovir ointment (ZOVIRAX) 5 % Apply 1 application topically every 3 (three) hours.    Marland Kitchen aspirin EC 81  MG tablet Take 81 mg by mouth daily.    . cholecalciferol (VITAMIN D) 1000 UNITS tablet Take 1,000 Units by mouth daily.    . fenofibrate 160 MG tablet Take 160 mg by mouth daily.    . fish oil-omega-3 fatty acids 1000 MG capsule Take 1 g by mouth daily.    Marland Kitchen FLUoxetine (PROZAC) 40 MG capsule Take 40 mg by mouth daily.    Marland Kitchen glucosamine-chondroitin 500-400 MG tablet Take 1 tablet by mouth daily.     Marland Kitchen lisinopril (PRINIVIL,ZESTRIL) 20 MG tablet Take 20 mg by mouth daily.    . Red Yeast Rice 600 MG CAPS Take 1 capsule by mouth daily.    Marland Kitchen terbinafine (LAMISIL) 1 % cream Apply 1 application topically as needed.      No current facility-administered medications for this visit.    Allergies  Allergen Reactions  . Crestor [Rosuvastatin]     MUSCLE ACHES   . Lipitor [Atorvastatin]     MUSCLE ACHES  . Strawberry Itching  . Zocor [Simvastatin]     MUSCLE ACHES     Patient Active Problem List   Diagnosis Date Noted  . Chest pain of uncertain etiology 03/16/3233  . Family history of ischemic heart disease 04/11/2014  . Pre-diabetes 07/13/2012    History  Smoking status  . Never Smoker   Smokeless tobacco  . Not on file    History  Alcohol  Use: Not on file    Family History  Problem Relation Age of Onset  . Aneurysm Mother   . Hypertension Mother   . Cancer Maternal Grandmother   . Heart attack Maternal Grandfather   . Esophageal cancer Paternal Grandfather   . HIV Son     Review of Systems: Constitutional: no fever chills diaphoresis or fatigue or change in weight.  Head and neck: no hearing loss, no epistaxis, no photophobia or visual disturbance. Respiratory: No cough, shortness of breath or wheezing. Cardiovascular: Positive for chest pain and dyspnea Gastrointestinal: No abdominal distention, no abdominal pain, no change in bowel habits hematochezia or melena. Genitourinary: No dysuria, no frequency, no urgency, no nocturia. Musculoskeletal:No arthralgias, no back  pain, no gait disturbance or myalgias. Neurological: No dizziness, no headaches, no numbness, no seizures, no syncope, no weakness, no tremors. Hematologic: No lymphadenopathy, no easy bruising. Psychiatric: Positive for depression   Wt Readings from Last 3 Encounters:  04/11/14 186 lb (84.369 kg)  02/06/14 179 lb (81.194 kg)  10/25/13 177 lb (80.287 kg)    Physical Exam: Filed Vitals:   04/11/14 1109  BP: 136/64  Pulse: 82   The patient appears to be in no distress.  Head and neck exam reveals that the pupils are equal and reactive.  The extraocular movements are full.  There is no scleral icterus.  Mouth and pharynx are benign.  No lymphadenopathy.  No carotid bruits.  The jugular venous pressure is normal.  Thyroid is not enlarged or tender.  Chest is clear to percussion and auscultation.  No rales or rhonchi.  Expansion of the chest is symmetrical.  Heart reveals no abnormal lift or heave.  First and second heart sounds are normal.  There is no murmur gallop rub or click.  The abdomen is soft and nontender.  Bowel sounds are normoactive.  There is no hepatosplenomegaly or mass.  There are no abdominal bruits.  Extremities reveal no phlebitis or edema.  Pedal pulses are good.  There is no cyanosis or clubbing.  Neurologic exam is normal strength and no lateralizing weakness.  No sensory deficits.  Integument reveals no rash  EKG shows normal sinus rhythm and minimal ST segment abnormality.  Since previous tracing of 2008, no significant change  Assessment / Plan: 1.  Chest pain uncertain etiology 2.  Essential hypertension without heart failure 3.  Dyspnea on exertion 4.  Hypercholesterolemia with statin intolerance 5.  Family history of ischemic heart disease in her father  Plan: We will have her return for a treadmill Myoview stress test. Many thanks for the opportunity to see this pleasant woman with you.  We will be in touch regarding the results of her stress test.   We will be glad to see her back here in our office when necessary basis.

## 2014-04-11 NOTE — Patient Instructions (Signed)
Your physician recommends that you continue on your current medications as directed. Please refer to the Current Medication list given to you today.  Your physician has requested that you have en exercise stress myoview. For further information please visit [email or web address]. Please follow instruction sheet, as given.  Follow up as needed

## 2014-04-16 ENCOUNTER — Ambulatory Visit (HOSPITAL_COMMUNITY): Payer: 59 | Attending: Cardiology | Admitting: Radiology

## 2014-04-16 DIAGNOSIS — R0602 Shortness of breath: Secondary | ICD-10-CM | POA: Diagnosis not present

## 2014-04-16 DIAGNOSIS — R0609 Other forms of dyspnea: Secondary | ICD-10-CM | POA: Diagnosis not present

## 2014-04-16 DIAGNOSIS — R079 Chest pain, unspecified: Secondary | ICD-10-CM | POA: Insufficient documentation

## 2014-04-16 DIAGNOSIS — Z8249 Family history of ischemic heart disease and other diseases of the circulatory system: Secondary | ICD-10-CM | POA: Diagnosis not present

## 2014-04-16 DIAGNOSIS — I10 Essential (primary) hypertension: Secondary | ICD-10-CM | POA: Diagnosis not present

## 2014-04-16 DIAGNOSIS — R0789 Other chest pain: Secondary | ICD-10-CM

## 2014-04-16 MED ORDER — TECHNETIUM TC 99M SESTAMIBI GENERIC - CARDIOLITE
10.0000 | Freq: Once | INTRAVENOUS | Status: AC | PRN
  Administered 2014-04-16: 10 via INTRAVENOUS

## 2014-04-16 MED ORDER — TECHNETIUM TC 99M SESTAMIBI GENERIC - CARDIOLITE
30.0000 | Freq: Once | INTRAVENOUS | Status: AC | PRN
  Administered 2014-04-16: 30 via INTRAVENOUS

## 2014-04-16 NOTE — Progress Notes (Signed)
[facility] [address] [place], [place] [address] [phone]    Cardiology Nuclear Med Study  Lori Walsh is a 59 y.o. female     MRN : [id number]     DOB: 07-Dec-1955  Procedure Date: 04/16/2014  Nuclear Med Background Indication for Stress Test:  Evaluation for Ischemia History:  MPI 2008 (?scar) EF 72% Cardiac Risk Factors: Family History - CAD and Hypertension  Symptoms:  Chest Pain, DOE and SOB   Nuclear Pre-Procedure Caffeine/Decaff Intake:  None> 12 hrs NPO After: 8:00pm   Lungs:  clear O2 Sat: 97% on room air. IV 0.9% NS with Angio Cath:  22g  IV Site: R Antecubital x 1, tolerated well IV Started by:  Irven Baltimore, RN  Chest Size (in):  42 Cup Size: D  Height: 5\' 4"  (1.626 m)  Weight:  182 lb (82.555 kg)  BMI:  Body mass index is 31.22 kg/(m^2). Tech Comments:  N/A    Nuclear Med Study 1 or 2 day study: 1 day  Stress Test Type:  Stress  Reading MD: N/A  Order Authorizing Provider:  Darlin Coco, MD  Resting Radionuclide: Technetium 7m Sestamibi  Resting Radionuclide Dose: 11.0 mCi   Stress Radionuclide:  Technetium 44m Sestamibi  Stress Radionuclide Dose: 33.0 mCi           Stress Protocol Rest HR: 65 Stress HR: 148  Rest BP: 187/89 Stress BP: 237/67  Exercise Time (min): 6:00 METS: 7.0   Predicted Max HR: 160 bpm % Max HR: 92.5 bpm Rate Pressure Product: 35076   Dose of Adenosine (mg):  n/a Dose of Lexiscan: n/a mg  Dose of Atropine (mg): n/a Dose of Dobutamine: n/a mcg/kg/min (at max HR)  Stress Test Technologist: Glade Lloyd, BS-ES  Nuclear Technologist:  Earl Many, CNMT     Rest Procedure:  Myocardial perfusion imaging was performed at rest 45 minutes following the intravenous administration of Technetium 48m Sestamibi. Rest ECG: Normal sinus rhythm with normal EKG  Stress Procedure:  The patient exercised on the treadmill utilizing the Bruce Protocol for 6:00 minutes. The patient stopped due to  fatigue and denied any chest pain.  Technetium 8m Sestamibi was injected at peak exercise and myocardial perfusion imaging was performed after a brief delay. Stress ECG: No significant change from baseline ECG  QPS Raw Data Images:  Normal; no motion artifact; normal heart/lung ratio. Stress Images:  Normal homogeneous uptake in all areas of the myocardium. Rest Images:  Normal homogeneous uptake in all areas of the myocardium. Subtraction (SDS):  No evidence of ischemia. Transient Ischemic Dilatation (Normal <1.22):  0.76 Lung/Heart Ratio (Normal <0.45):  0.31  Quantitative Gated Spect Images QGS EDV:  69 ml QGS ESV:  12 ml  Impression Exercise Capacity:  Fair exercise capacity. BP Response:  Hypertensive blood pressure response. Clinical Symptoms:  Fatigue ECG Impression:  No significant ST segment change suggestive of ischemia. Comparison with Prior Nuclear Study: This study is compared with the report of a study from September, 2008  Overall Impression:  Normal stress nuclear study. This is a low risk scan. There is no scar or ischemia. This study appears to be improved from the report of September, 2008. At that time there was felt to be some stress-induced ischemia in the anterior wall. This finding is not present at this time.  LV Ejection Fraction: 82%.  LV Wall Motion:  Normal Wall Motion   Dola [place], MD

## 2014-04-17 ENCOUNTER — Telehealth: Payer: Self-pay | Admitting: Cardiology

## 2014-04-17 NOTE — Telephone Encounter (Signed)
Advised patient and will forward to PCP

## 2014-04-17 NOTE — Telephone Encounter (Signed)
New message ° ° ° ° °Returning Lori Walsh's call  ° °

## 2014-04-17 NOTE — Telephone Encounter (Signed)
-----   Message from Darlin Coco, MD sent at 04/17/2014  8:06 AM EST ----- Please report.  The stress test is normal.  Left ventricular function is strong.  There was no evidence of ischemia.  Please send copy of report to her PCP Dr.Nnodi

## 2014-04-17 NOTE — Telephone Encounter (Signed)
Left message to call back  

## 2014-05-21 ENCOUNTER — Ambulatory Visit (INDEPENDENT_AMBULATORY_CARE_PROVIDER_SITE_OTHER): Payer: Self-pay | Admitting: Family Medicine

## 2014-05-21 VITALS — BP 124/66 | Ht 64.0 in | Wt 181.0 lb

## 2014-05-21 DIAGNOSIS — R7303 Prediabetes: Secondary | ICD-10-CM

## 2014-05-21 DIAGNOSIS — R7309 Other abnormal glucose: Secondary | ICD-10-CM

## 2014-05-21 NOTE — Progress Notes (Signed)
Patient presents today for 3 month pre-diabetes follow-up as part of the employer-sponsored Link to Wellness program. Pt is not currently being treated for diabetes. Patient also continues on daily ASA, ACEi, and red yeast rice. Most recent MD follow-up was Sept 2014. Patient has a pending appt for 3 mo follow-up. No med changes or major health changes at this time. Of note, patient will be transitioning to the care of Dr. Leighton Ruff as her current provider is moving to a different office.   Diabetes Assessment: Type of Diabetes: Pre-Diabetes; MD managing Diabetes Nnodi, [facility] at [facility]; Sees Diabetes provider 2 times per year; checks blood glucose 1-2 times a week; takes medications as prescribed; checks feet weekly; takes an aspirin a day; uses glucometer; hypoglycemia frequency None; Highest CBG 123; Lowest CBG 101 Other Diabetes History: Patient is remains pre-diabetic and is not currently being treated. She continues testing 1-2 times per week with no hypoglycemic episodes. Patient did not bring meter today but reports glucose readings range 100-120s with only occasional readings >120. Readings are random as patient has a hard time remembering to test fasting. Patient is due for eye exam and has apt scheduled for later this month. Denies any issues with feet or neuropathy.   Lifestyle Factors: Exercise - Exercise is sporadic at this time. 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 She is aware of the need for more routine exercise and will work on this over the coming months. She will set a goal of going to the gym once per week.  Diet - No major changes to diet, pateint continues attempting to make healthy choices and manage portion sizes.  Plan: 1) Continue to make healthy dietary choices 2) Attempt to go to gym at least once per week, consistently 3) Continue to test regularly 4) Follow-up in 3 months on Tuesday  June 7th @ 11:00 am

## 2014-06-10 NOTE — Progress Notes (Signed)
Patient ID: Lori Walsh, female   DOB: 1955-10-25, 59 y.o.   MRN: [id number] Reviewed: Agree with the documentation and management of our [place].

## 2014-08-13 ENCOUNTER — Encounter: Payer: Self-pay | Admitting: Pharmacist

## 2014-08-13 ENCOUNTER — Ambulatory Visit: Payer: 59 | Admitting: Pharmacist

## 2014-08-27 ENCOUNTER — Ambulatory Visit: Payer: 59 | Admitting: Pharmacist

## 2014-08-27 ENCOUNTER — Ambulatory Visit (INDEPENDENT_AMBULATORY_CARE_PROVIDER_SITE_OTHER): Payer: Self-pay | Admitting: Family Medicine

## 2014-08-27 VITALS — BP 136/70 | Ht 64.0 in | Wt 182.0 lb

## 2014-08-27 DIAGNOSIS — R7309 Other abnormal glucose: Secondary | ICD-10-CM

## 2014-08-27 DIAGNOSIS — R7303 Prediabetes: Secondary | ICD-10-CM

## 2014-08-27 NOTE — Progress Notes (Signed)
Subjective:  Patient is a 59 yo female with pre-diabetes who presents today for 3 month follow-up as part of the employer-sponsored Link to Wellness program. Pt is not currently treated for diabetes.  Patient also continues on daily ASA and ACEi.  She is not tolerant of statins but is on red yeast rice daily.  Most recent MD follow-up was April 2016. Patient has a pending appt for 6 mo follow-up. No med changes or major health changes at this time.  Diabetes Assessment:  No changes to pre-diabetes treatment at this time. Patient does maintain good medication compliance. Most recent A1c was 5.9% which is at goal of less than 6%.  Weight has remained unchanged since last visit.  Patient did not bring meter today but is currently testing 1-2 times per week, fasting.  Glucose readings are 100-110 per pt report.  Patient denies signs and symptoms of neuropathy including numbness/tingling/burning and symptoms of foot infection.  Patient is up to date on eye and dental exam.      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  Patient would like to increase activity and will attempt to walk and bike more over the summer months while on vacation.  She and her husband are also interested in kayaking for fun and activity.     Diet:  No changes, maintaining standard diet, limiting sweets, limiting dairy and milk/icecream in attempt to improve diet and reduce GI upset.     Plan and Goals: 1)  Attempt to increase exercise by increasing walking and biking, especially while camping.  Consider kayaking as a means of exercise. 2)  Continue to make healthy dietary choices, limiting dairy and milk/icecream 3)  Continue testing as needed 4)  Follow-up in 3 months on Tuesday Oct 4th @ 11:00 am   Tilman Neat, PharmD [facility]  [phone]

## 2014-08-27 NOTE — Patient Instructions (Addendum)
1)  Attempt to increase exercise by increasing walking and biking, especially while camping.  Consider kayaking as a means of exercise. 2)  Continue to make healthy dietary choices, limiting dairy and milk/icecream 3)  Continue testing as needed 4)  Follow-up in 3 months on Tuesday Oct 4th @ 11:00 am  Great to see you today!

## 2014-08-28 LAB — POCT GLYCOSYLATED HEMOGLOBIN (HGB A1C): HEMOGLOBIN A1C: 5.9

## 2014-09-10 NOTE — Progress Notes (Signed)
Patient ID: Lori Walsh, female   DOB: 1956-01-02, 59 y.o.   MRN: [id number] ATTENDING PHYSICIAN NOTE: I have reviewed the chart and agree with the plan as detailed above. Dorcas Mcmurray MD Pager [phone]

## 2014-11-05 ENCOUNTER — Encounter: Payer: Self-pay | Admitting: *Deleted

## 2014-11-05 ENCOUNTER — Encounter: Payer: 59 | Attending: Family Medicine | Admitting: *Deleted

## 2014-11-05 DIAGNOSIS — Z713 Dietary counseling and surveillance: Secondary | ICD-10-CM | POA: Diagnosis not present

## 2014-11-05 DIAGNOSIS — R7309 Other abnormal glucose: Secondary | ICD-10-CM | POA: Insufficient documentation

## 2014-11-05 DIAGNOSIS — R7303 Prediabetes: Secondary | ICD-10-CM

## 2014-11-05 NOTE — Progress Notes (Signed)
Diabetes Self-Management Education  Visit Type: First/Initial  Appt. Start Time: 1015 Appt. End Time: 1115  11/05/2014  [name], identified by name and date of birth, is a 59 y.o. female with a diagnosis of Diabetes: Pre-Diabetes.   ASSESSMENT       Diabetes Self-Management Education - 11/05/14 1035    Visit Information   Visit Type First/Initial   Initial Visit   Diabetes Type Pre-Diabetes   Are you currently following a meal plan? No   Are you taking your medications as prescribed? Yes   Date Diagnosed 3 years   Health Coping   How would you rate your overall health? Good   Psychosocial Assessment   Patient Belief/Attitude about Diabetes Motivated to manage diabetes   Self-care barriers None   Self-management support --  pharmacist   Other persons present Patient   Patient Concerns Glycemic Control   Special Needs None   Preferred Learning Style Other (comment)  by example   Learning Readiness Ready   How often do you need to have someone help you when you read instructions, pamphlets, or other written materials from your doctor or pharmacy? 1 - Never   What is the last grade level you completed in school? bachelor's   Complications   Last HgB A1C per patient/outside source 5.9 %   How often do you check your blood sugar? --  1/every couple weeks   Fasting Blood glucose range (mg/dL) 70-129   Postprandial Blood glucose range (mg/dL) 130-179   Number of hypoglycemic episodes per month 0   Number of hyperglycemic episodes per week --  hasn't been >200 mg/dl, but has been fatigues   Have you had a dilated eye exam in the past 12 months? Yes   Have you had a dental exam in the past 12 months? Yes   Are you checking your feet? Yes   How many days per week are you checking your feet? 1   Dietary Intake   Breakfast omelte with bacon and cheese,coffee with cream, water   Snack (morning) none   Lunch apple walnut salad with dressing   Snack (afternoon) candy  bar   Dinner 2 slices [employer]   Snack (evening) none   Beverage(s) coffee, water, 1/2 and 1/2 tea, skim milk, wine   Exercise   Exercise Type ADL's  wears FitBit, and is trying to increasing her steps   How many days per week to you exercise? 0   How many minutes per day do you exercise? 0   Total minutes per week of exercise 0   Patient Education   Previous Diabetes Education Yes (please comment)  prediabetes class at [facility]   Disease state  Definition of diabetes, type 1 and 2, and the diagnosis of diabetes;Factors that contribute to the development of diabetes   Physical activity and exercise  Role of exercise on diabetes management, blood pressure control and cardiac health.;Helped patient identify appropriate exercises in relation to his/her diabetes, diabetes complications and other health issue.   Monitoring Interpreting lab values - A1C, lipid, urine microalbumina.;Identified appropriate SMBG and/or A1C goals.;Daily foot exams;Yearly dilated eye exam   Acute complications Taught treatment of hypoglycemia - the 15 rule.;Covered sick day management with medication and food.   Chronic complications Relationship between chronic complications and blood glucose control;Lipid levels, blood glucose control and heart disease;Reviewed with patient heart disease, higher risk of, and prevention   Psychosocial adjustment Role of stress on diabetes   Individualized Goals (developed  by patient)   Nutrition General guidelines for healthy choices and portions discussed   Physical Activity Exercise 3-5 times per week   Monitoring  test my blood glucose as discussed   Outcomes   Expected Outcomes Demonstrated interest in learning. Expect positive outcomes   Future DMSE PRN   Program Status Completed      Individualized Plan for Diabetes Self-Management Training:   Learning Objective:  Patient will have a greater understanding of diabetes self-management. Patient education plan is to attend  individual and/or group sessions per assessed needs and concerns.    Expected Outcomes:  Demonstrated interest in learning. Expect positive outcomes  Education material provided: Living Well with Diabetes and Meal plan card  If problems or questions, patient to contact team via:  Phone  Future DSME appointment: PRN

## 2014-12-10 ENCOUNTER — Encounter: Payer: Self-pay | Admitting: Pharmacist

## 2014-12-10 ENCOUNTER — Ambulatory Visit: Payer: 59 | Admitting: Pharmacist

## 2014-12-10 ENCOUNTER — Ambulatory Visit: Payer: Self-pay | Admitting: Pharmacist

## 2014-12-10 NOTE — Progress Notes (Unsigned)
Subjective:  Patient is a 59 yo female with pre-diabetes who presents today for 3 month follow-up as part of the employer-sponsored Link to Wellness program. Pt is not currently treated for diabetes.  Patient also continues on daily ASA and ACEi.  She is not tolerant of statins but is on red yeast rice daily.  Most recent MD follow-up was April 2016. Patient has a pending appt for 6 mo follow-up in November. No med changes or major health changes at this time.   Diabetes Assessment:  No changes to pre-diabetes treatment at this time. Patient does maintain good medication compliance. Most recent A1c was done today via POC testing result of 5.8% (prev 5.9%) which is improved and at goal of less than 6%.  Weight has decreased by 5 lbs since last visit.  Patient did not bring meter today but is currently testing 1-2 times per week, fasting.  Glucose readings are 100-110 per pt report.  Patient denies signs and symptoms of neuropathy including numbness/tingling/burning and symptoms of foot infection.  Patient is up to date on eye and dental exam.      Lifestyle Assessment:  Exercise:  Wearing fitbit now, walking dog daily.  Patient has worked to increase activity and has done more walking and biking over the summer months while on vacation.  Patient also recently started a yoga class once weekly for 1 hour.    Diet:  Patient has made some changes, including making overall better choices, limiting bread (ex making sandwiches between lettuce vs bread), limiting sweets.  Patient has also improved management of portion sizes.  These changes along with improved exercise has helped pt lose 5 lbs recently.      Plan and Goals: 1)  Continue to increase exercise by walking and biking, especially while camping.   2)  Continue to make healthy dietary choices, limiting carbohydrates 3)  Continue testing as needed 4)  Follow-up in 3 months on Wednesday Jan 4th @ 11:00 am  Great to see you today!   Tilman Neat, PharmD [facility]  [phone]

## 2014-12-10 NOTE — Patient Instructions (Signed)
1)  Continue to increase exercise by walking and biking, especially while camping.   2)  Continue to make healthy dietary choices, limiting carbohydrates 3)  Continue testing as needed 4)  Follow-up in 3 months on Wednesday Jan 4th @ 11:00 am  Great to see you today!

## 2015-01-01 ENCOUNTER — Ambulatory Visit (INDEPENDENT_AMBULATORY_CARE_PROVIDER_SITE_OTHER): Payer: 59 | Admitting: Family Medicine

## 2015-01-01 ENCOUNTER — Encounter: Payer: Self-pay | Admitting: Family Medicine

## 2015-01-01 VITALS — BP 140/65 | HR 65 | Wt 184.0 lb

## 2015-01-01 DIAGNOSIS — M2170 Unequal limb length (acquired), unspecified site: Secondary | ICD-10-CM

## 2015-01-01 DIAGNOSIS — M7062 Trochanteric bursitis, left hip: Secondary | ICD-10-CM | POA: Diagnosis not present

## 2015-01-01 NOTE — Assessment & Plan Note (Signed)
Felt pad placed today. Return for orthotics

## 2015-01-01 NOTE — Patient Instructions (Signed)
Thank you for coming in today. Attend physical therapy.  Return in 4 weeks.  Call or go to the ER if you develop a large red swollen joint with extreme pain or oozing puss.   Trochanteric Bursitis You have hip pain due to trochanteric bursitis. Bursitis means that the sack near the outside of the hip is filled with fluid and inflamed. This sack is made up of protective soft tissue. The pain from trochanteric bursitis can be severe and keep you from sleep. It can radiate to the buttocks or down the outside of the thigh to the knee. The pain is almost always worse when rising from the seated or lying position and with walking. Pain can improve after you take a few steps. It happens more often in people with hip joint and lumbar spine problems, such as arthritis or previous surgery. Very rarely the trochanteric bursa can become infected, and antibiotics and/or surgery may be needed. Treatment often includes an injection of local anesthetic mixed with cortisone medicine. This medicine is injected into the area where it is most tender over the hip. Repeat injections may be necessary if the response to treatment is slow. You can apply ice packs over the tender area for 30 minutes every 2 hours for the next few days. Anti-inflammatory and/or narcotic pain medicine may also be helpful. Limit your activity for the next few days if the pain continues. See your caregiver in 5-10 days if you are not greatly improved.  SEEK IMMEDIATE MEDICAL CARE IF:  You develop severe pain, fever, or increased redness.  You have pain that radiates below the knee. EXERCISES STRETCHING EXERCISES - Trochanteric Bursitis  These exercises may help you when beginning to rehabilitate your injury. Your symptoms may resolve with or without further involvement from your physician, physical therapist, or athletic trainer. While completing these exercises, remember:   Restoring tissue flexibility helps normal motion to return to the  joints. This allows healthier, less painful movement and activity.  An effective stretch should be held for at least 30 seconds.  A stretch should never be painful. You should only feel a gentle lengthening or release in the stretched tissue. STRETCH - Iliotibial Band  On the floor or bed, lie on your side so your injured leg is on top. Bend your knee and grab your ankle.  Slowly bring your knee back so that your thigh is in line with your trunk. Keep your heel at your buttocks and gently arch your back so your head, shoulders and hips line up.  Slowly lower your leg so that your knee approaches the floor/bed until you feel a gentle stretch on the outside of your thigh. If you do not feel a stretch and your knee will not fall farther, place the heel of your opposite foot on top of your knee and pull your thigh down farther.  Hold this stretch for __________ seconds.  Repeat __________ times. Complete this exercise __________ times per day. STRETCH - Hamstrings, Supine   Lie on your back. Loop a belt or towel over the ball of your foot as shown.  Straighten your knee and slowly pull on the belt to raise your injured leg. Do not allow the knee to bend. Keep your opposite leg flat on the floor.  Raise the leg until you feel a gentle stretch behind your knee or thigh. Hold this position for __________ seconds.  Repeat __________ times. Complete this stretch __________ times per day. STRETCH - Quadriceps, Prone  Lie on your stomach on a firm surface, such as a bed or padded floor.  Bend your knee and grasp your ankle. If you are unable to reach your ankle or pant leg, use a belt around your foot to lengthen your reach.  Gently pull your heel toward your buttocks. Your knee should not slide out to the side. You should feel a stretch in the front of your thigh and/or knee.  Hold this position for __________ seconds.  Repeat __________ times. Complete this stretch __________ times per  day. STRETCHING - Hip Flexors, Lunge Half kneel with your knee on the floor and your opposite knee bent and directly over your ankle.  Keep good posture with your head over your shoulders. Tighten your buttocks to point your tailbone downward; this will prevent your back from arching too much.  You should feel a gentle stretch in the front of your thigh and/or hip. If you do not feel any resistance, slightly slide your opposite foot forward and then slowly lunge forward so your knee once again lines up over your ankle. Be sure your tailbone remains pointed downward.  Hold this stretch for __________ seconds.  Repeat __________ times. Complete this stretch __________ times per day. STRETCH - Adductors, Lunge  While standing, spread your legs.  Lean away from your injured leg by bending your opposite knee. You may rest your hands on your thigh for balance.  You should feel a stretch in your inner thigh. Hold for __________ seconds.  Repeat __________ times. Complete this exercise __________ times per day.   This information is not intended to replace advice given to you by your health care provider. Make sure you discuss any questions you have with your health care provider.   Document Released: 04/01/2004 Document Revised: 07/09/2014 Document Reviewed: 06/06/2008 Elsevier Interactive Patient Education [employer].

## 2015-01-01 NOTE — Progress Notes (Signed)
Lori Walsh is a 59 y.o. female who presents to [facility]  today for left hip and buttocks pain. Symptoms present for one month without recent injury. Pain is worse when she lies on her left side and when she is rising from a seated position. She denies any fevers chills nausea vomiting or diarrhea. She has not had significant treatment for this problem yet. She notes a history of a broken coccyx in her 76s but no recent injury. She denies any radiating pain weakness or bowel bladder dysfunction.   Past Medical History  Diagnosis Date  . Menopause   . Gestational diabetes   . Stress reaction     WITH DEPRESSION AND SLEEP DISORDER  . Fibroadenoma of right breast   . Thyroid nodule   . Hypertension   . Hyperlipidemia   . GDM (gestational diabetes mellitus)    Past Surgical History  Procedure Laterality Date  . Btl  1987  . Tubalplasty  1981  . Colonoscopy  2007  . Fna of thyroid       ABOUT 20 YEARS AGO   Social History  Substance Use Topics  . Smoking status: Never Smoker   . Smokeless tobacco: Not on file  . Alcohol Use: Not on file   family history includes Aneurysm in her mother; Cancer in her maternal grandmother; Esophageal cancer in her paternal grandfather; HIV in her son; Heart attack in her maternal grandfather; Hypertension in her mother.  ROS as above Medications: Current Outpatient Prescriptions  Medication Sig Dispense Refill  . acyclovir ointment (ZOVIRAX) 5 % Apply 1 application topically every 3 (three) hours.    Marland Kitchen aspirin EC 81 MG tablet Take 81 mg by mouth daily.    . cholecalciferol (VITAMIN D) 1000 UNITS tablet Take 1,000 Units by mouth daily.    . fenofibrate 160 MG tablet Take 160 mg by mouth daily.    . fish oil-omega-3 fatty acids 1000 MG capsule Take 1 g by mouth daily.    Marland Kitchen FLUoxetine (PROZAC) 40 MG capsule Take 40 mg by mouth daily.    Marland Kitchen glucosamine-chondroitin 500-400 MG tablet Take 1 tablet by mouth daily.      Marland Kitchen lisinopril (PRINIVIL,ZESTRIL) 20 MG tablet Take 20 mg by mouth daily.    . Red Yeast Rice 600 MG CAPS Take 1 capsule by mouth daily.    Marland Kitchen terbinafine (LAMISIL) 1 % cream Apply 1 application topically as needed.      No current facility-administered medications for this visit.   Allergies  Allergen Reactions  . Crestor [Rosuvastatin]     MUSCLE ACHES   . Lipitor [Atorvastatin]     MUSCLE ACHES  . Strawberry Extract Itching  . Zocor [Simvastatin]     MUSCLE ACHES      Exam:  BP 140/65 mmHg  Pulse 65  Wt 184 lb (83.462 kg) Gen: Well NAD HEENT: EOMI,  MMM Lungs: Normal work of breathing. CTABL Heart: RRR no MRG Abd: NABS, Soft. Nondistended, Nontender Exts: Brisk capillary refill, warm and well perfused.  Back: Nontender to midline normal back range of motion. Hip tender palpation greater trochanter and along the medial aspect of her gluteus muscles on the left side. Hip range of motion is intact and normal bilaterally. Hip abduction strength is 4/5 on the left. Leg length: Left leg is 1 cm longer than the right leg. Gait nonantalgic   Greater trochanteric bursa injection: Left Consent obtained and timeout performed.  Patient laying on  side with affected hip up.   Area located and marked.   Skin cleaned with rubbing alcohol and chlorhexidine, and cold spray applied Using a spinal needle the greater trochanteric bursa was accessed.   40 mg of Kenalog and 4 mL of Marcaine were injected in a wheel pattern.   Immediate improvement following injection:  Patient tolerated procedure well with no weakness or numbness or bleeding.     No results found for this or any previous visit (from the past 24 hour(s)). No results found.   Please see individual assessment and plan sections.

## 2015-01-01 NOTE — Assessment & Plan Note (Signed)
Injection today. Refer to physical therapy. Home exercise program. Recheck 4 weeks. Chronic leg length difference with felt pad.

## 2015-01-03 ENCOUNTER — Encounter: Payer: Self-pay | Admitting: Family Medicine

## 2015-01-03 ENCOUNTER — Ambulatory Visit (INDEPENDENT_AMBULATORY_CARE_PROVIDER_SITE_OTHER): Payer: 59

## 2015-01-03 ENCOUNTER — Ambulatory Visit (INDEPENDENT_AMBULATORY_CARE_PROVIDER_SITE_OTHER): Payer: 59 | Admitting: Family Medicine

## 2015-01-03 VITALS — BP 157/68 | HR 75 | Wt 183.0 lb

## 2015-01-03 DIAGNOSIS — R102 Pelvic and perineal pain: Secondary | ICD-10-CM | POA: Diagnosis not present

## 2015-01-03 DIAGNOSIS — M7062 Trochanteric bursitis, left hip: Secondary | ICD-10-CM

## 2015-01-03 DIAGNOSIS — M791 Myalgia: Secondary | ICD-10-CM

## 2015-01-03 DIAGNOSIS — M7918 Myalgia, other site: Secondary | ICD-10-CM

## 2015-01-03 MED ORDER — TRAMADOL HCL 50 MG PO TABS: 50.0000 mg | ORAL_TABLET | Freq: Three times a day (TID) | ORAL | Status: DC | PRN

## 2015-01-03 NOTE — Patient Instructions (Signed)
Thank you for coming in today. Call or go to the ER if you develop a large red swollen joint with extreme pain or oozing puss.  Come back or go to the emergency room if you notice new weakness new numbness problems walking or bowel or bladder problems. Return if not getting better.

## 2015-01-03 NOTE — Progress Notes (Signed)
Lori Walsh is a 59 y.o. female who presents to [facility]  today for hip and buttocks pain. Patient was seen 2 days ago for initial evaluation of hip and buttocks pain. She was thought to have greater trochanteric bursitis and spasm or strain of one of the hip stabilizers in the buttocks area. She received a greater trochanteric injection. Since then she's had worsening pain especially in the medial left buttocks. She denies any radiating pain weakness or numbness fevers or chills. Pain is severe.   Past Medical History  Diagnosis Date  . Menopause   . Gestational diabetes   . Stress reaction     WITH DEPRESSION AND SLEEP DISORDER  . Fibroadenoma of right breast   . Thyroid nodule   . Hypertension   . Hyperlipidemia   . GDM (gestational diabetes mellitus)    Past Surgical History  Procedure Laterality Date  . Btl  1987  . Tubalplasty  1981  . Colonoscopy  2007  . Fna of thyroid       ABOUT 20 YEARS AGO   Social History  Substance Use Topics  . Smoking status: Never Smoker   . Smokeless tobacco: Not on file  . Alcohol Use: Not on file   family history includes Aneurysm in her mother; Cancer in her maternal grandmother; Esophageal cancer in her paternal grandfather; HIV in her son; Heart attack in her maternal grandfather; Hypertension in her mother.  ROS as above Medications: Current Outpatient Prescriptions  Medication Sig Dispense Refill  . acyclovir ointment (ZOVIRAX) 5 % Apply 1 application topically every 3 (three) hours.    Marland Kitchen aspirin EC 81 MG tablet Take 81 mg by mouth daily.    . cholecalciferol (VITAMIN D) 1000 UNITS tablet Take 1,000 Units by mouth daily.    . fenofibrate 160 MG tablet Take 160 mg by mouth daily.    . fish oil-omega-3 fatty acids 1000 MG capsule Take 1 g by mouth daily.    Marland Kitchen FLUoxetine (PROZAC) 40 MG capsule Take 40 mg by mouth daily.    Marland Kitchen glucosamine-chondroitin 500-400 MG tablet Take 1 tablet by mouth daily.      Marland Kitchen lisinopril (PRINIVIL,ZESTRIL) 20 MG tablet Take 20 mg by mouth daily.    . Red Yeast Rice 600 MG CAPS Take 1 capsule by mouth daily.    Marland Kitchen terbinafine (LAMISIL) 1 % cream Apply 1 application topically as needed.     . traMADol (ULTRAM) 50 MG tablet Take 1 tablet (50 mg total) by mouth every 8 (eight) hours as needed. 30 tablet 0   No current facility-administered medications for this visit.   Allergies  Allergen Reactions  . Crestor [Rosuvastatin]     MUSCLE ACHES   . Lipitor [Atorvastatin]     MUSCLE ACHES  . Strawberry Extract Itching  . Zocor [Simvastatin]     MUSCLE ACHES      Exam:  BP 157/68 mmHg  Pulse 75  Wt 183 lb (83.008 kg) Gen: Well NAD HEENT: EOMI,  MMM Lungs: Normal work of breathing. CTABL Heart: RRR no MRG Abd: NABS, Soft. Nondistended, Nontender Exts: Brisk capillary refill, warm and well perfused.  Left hip: Slight bruising overlying the greater trochanter otherwise normal normal-appearing without any erythema or induration. The posterior buttocks area is tender to palpation along the medial border. The coccyx itself is nontender. Motion is normal  X-ray pelvis pending   Assessment and plan: 59 year old woman with postinjection steroid flare. Plan for watchful  waiting with tramadol. X-ray pelvis pending. Return next week if not improving

## 2015-01-06 ENCOUNTER — Encounter: Payer: 59 | Admitting: Sports Medicine

## 2015-01-06 ENCOUNTER — Ambulatory Visit (INDEPENDENT_AMBULATORY_CARE_PROVIDER_SITE_OTHER): Payer: 59 | Admitting: Family Medicine

## 2015-01-06 ENCOUNTER — Encounter: Payer: Self-pay | Admitting: Family Medicine

## 2015-01-06 VITALS — BP 139/100 | HR 73 | Wt 183.0 lb

## 2015-01-06 DIAGNOSIS — M2170 Unequal limb length (acquired), unspecified site: Secondary | ICD-10-CM

## 2015-01-06 DIAGNOSIS — M791 Myalgia: Secondary | ICD-10-CM

## 2015-01-06 DIAGNOSIS — M7062 Trochanteric bursitis, left hip: Secondary | ICD-10-CM

## 2015-01-06 DIAGNOSIS — M7918 Myalgia, other site: Secondary | ICD-10-CM

## 2015-01-06 MED ORDER — TRAMADOL HCL 50 MG PO TABS: 50.0000 mg | ORAL_TABLET | Freq: Three times a day (TID) | ORAL | Status: DC | PRN

## 2015-01-06 NOTE — Patient Instructions (Signed)
Thank you for coming in today. Return in  2-4 weeks.  Attend physical therapy.

## 2015-01-06 NOTE — Progress Notes (Signed)
Orthotics Note:   Patient was fitted for a : standard, cushioned, semi-rigid orthotic. The orthotic was heated and afterward the patient stood on the orthotic blank positioned on the orthotic stand. The patient was positioned in subtalar neutral position and 10 degrees of ankle dorsiflexion in a weight bearing stance. After completion of molding, a stable base was applied to the orthotic blank. The blank was ground to a stable position for weight bearing. Size: 8 Base: [employer] safety [employer] and Padding: Right 1/4 taller than left The patient ambulated these, and they were very comfortable.  I spent 40 minutes with this patient, greater than 50% was face-to-face time counseling regarding the below diagnosis.  Tramadol refilled. Recheck in 2-4 weeks.

## 2015-01-06 NOTE — Progress Notes (Signed)
Quick Note:  Xray shows some mild arthritis. Feeling any better? ______

## 2015-01-07 ENCOUNTER — Emergency Department (HOSPITAL_COMMUNITY)
Admission: EM | Admit: 2015-01-07 | Discharge: 2015-01-07 | Disposition: A | Discharge status: Home / Self Care | Payer: 59 | Attending: Emergency Medicine | Admitting: Emergency Medicine

## 2015-01-07 ENCOUNTER — Emergency Department (HOSPITAL_COMMUNITY): Payer: 59

## 2015-01-07 ENCOUNTER — Encounter (HOSPITAL_COMMUNITY): Payer: Self-pay

## 2015-01-07 DIAGNOSIS — Z8632 Personal history of gestational diabetes: Secondary | ICD-10-CM | POA: Insufficient documentation

## 2015-01-07 DIAGNOSIS — Z79899 Other long term (current) drug therapy: Secondary | ICD-10-CM | POA: Diagnosis not present

## 2015-01-07 DIAGNOSIS — R51 Headache: Secondary | ICD-10-CM | POA: Insufficient documentation

## 2015-01-07 DIAGNOSIS — E785 Hyperlipidemia, unspecified: Secondary | ICD-10-CM | POA: Insufficient documentation

## 2015-01-07 DIAGNOSIS — Z7982 Long term (current) use of aspirin: Secondary | ICD-10-CM | POA: Diagnosis not present

## 2015-01-07 DIAGNOSIS — R519 Headache, unspecified: Secondary | ICD-10-CM

## 2015-01-07 DIAGNOSIS — I10 Essential (primary) hypertension: Secondary | ICD-10-CM | POA: Diagnosis not present

## 2015-01-07 MED ORDER — MORPHINE SULFATE (PF) 4 MG/ML IV SOLN
4.0000 mg | Freq: Once | INTRAVENOUS | Status: AC
  Administered 2015-01-07: 4 mg via INTRAVENOUS
  Filled 2015-01-07: qty 1

## 2015-01-07 NOTE — Discharge Instructions (Signed)

## 2015-01-07 NOTE — ED Notes (Signed)
Pt. Coming from work with sudden onset severe headache. Pt. Experiencing emotional/stress problems this morning. Headache came on after 10 minutes of crying. No reports of n/v accompanying ha.

## 2015-01-07 NOTE — ED Provider Notes (Signed)
CSN: [id number]     Arrival date & time 01/07/15  2482 History   First MD Initiated Contact with Patient 01/07/15 [phone]     Chief Complaint  Patient presents with  . Headache     (Consider location/radiation/quality/duration/timing/severity/associated sxs/prior Treatment) HPI Comments: Patient presents to the ED with a chief complaint of sudden onset, severe headache.  Patient states that she was crying heavily due to life stressors.  States that while crying, she had sudden onset, severe, frontal headache.  She states that the pain was an 8/10 and is now a 5/10.  She reports that she has never had a headache this bad before.  She denies any associated symptoms.  There are no aggravating or alleviating factors.  She does not take any anticoagulants.  She has not taken any medications for the symptoms.  The history is provided by the patient. No language interpreter was used.    Past Medical History  Diagnosis Date  . Menopause   . Gestational diabetes   . Stress reaction     WITH DEPRESSION AND SLEEP DISORDER  . Fibroadenoma of right breast   . Thyroid nodule   . Hypertension   . Hyperlipidemia   . GDM (gestational diabetes mellitus)    Past Surgical History  Procedure Laterality Date  . Btl  1987  . Tubalplasty  1981  . Colonoscopy  2007  . Fna of thyroid       ABOUT 20 YEARS AGO   Family History  Problem Relation Age of Onset  . Aneurysm Mother   . Hypertension Mother   . Cancer Maternal Grandmother   . Heart attack Maternal Grandfather   . Esophageal cancer Paternal Grandfather   . HIV Son    Social History  Substance Use Topics  . Smoking status: Never Smoker   . Smokeless tobacco: None  . Alcohol Use: Yes     Comment: occasional   OB History    No data available     Review of Systems  Constitutional: Negative for fever and chills.  Respiratory: Negative for shortness of breath.   Cardiovascular: Negative for chest pain.  Gastrointestinal: Negative for  nausea, vomiting, diarrhea and constipation.  Genitourinary: Negative for dysuria.  Neurological: Positive for headaches.  All other systems reviewed and are negative.     Allergies  Crestor; Lipitor; Strawberry extract; and Zocor  Home Medications   Prior to Admission medications   Medication Sig Start Date End Date Taking? Authorizing Provider  acyclovir ointment (ZOVIRAX) 5 % Apply 1 application topically every 3 (three) hours.    Historical Provider, MD  aspirin EC 81 MG tablet Take 81 mg by mouth daily.    Historical Provider, MD  cholecalciferol (VITAMIN D) 1000 UNITS tablet Take 1,000 Units by mouth daily.    Historical Provider, MD  fenofibrate 160 MG tablet Take 160 mg by mouth daily.    Historical Provider, MD  fish oil-omega-3 fatty acids 1000 MG capsule Take 1 g by mouth daily.    Historical Provider, MD  FLUoxetine (PROZAC) 40 MG capsule Take 40 mg by mouth daily.    Historical Provider, MD  glucosamine-chondroitin 500-400 MG tablet Take 1 tablet by mouth daily.     Historical Provider, MD  lisinopril (PRINIVIL,ZESTRIL) 20 MG tablet Take 20 mg by mouth daily.    Historical Provider, MD  Red Yeast Rice 600 MG CAPS Take 1 capsule by mouth daily.    Historical Provider, MD  terbinafine (LAMISIL) 1 % cream  Apply 1 application topically as needed.     Historical Provider, MD  traMADol (ULTRAM) 50 MG tablet Take 1 tablet (50 mg total) by mouth every 8 (eight) hours as needed. 01/06/15   Gregor Hams, MD   BP 151/96 mmHg  Pulse 80  Temp(Src) 99.1 F (37.3 C)  Resp 20  Ht 5\' 4"  (1.626 m)  Wt 180 lb (81.647 kg)  BMI 30.88 kg/m2  SpO2 100% Physical Exam  Constitutional: She is oriented to person, place, and time. She appears well-developed and well-nourished.  HENT:  Head: Normocephalic and atraumatic.  Eyes: Conjunctivae and EOM are normal. Pupils are equal, round, and reactive to light.  Neck: Normal range of motion. Neck supple.  Cardiovascular: Normal rate and regular  rhythm.  Exam reveals no gallop and no friction rub.   No murmur heard. Pulmonary/Chest: Effort normal and breath sounds normal. No respiratory distress. She has no wheezes. She has no rales. She exhibits no tenderness.  Abdominal: Soft. Bowel sounds are normal. She exhibits no distension and no mass. There is no tenderness. There is no rebound and no guarding.  Musculoskeletal: Normal range of motion. She exhibits no edema or tenderness.  Neurological: She is alert and oriented to person, place, and time.  CN 3-12 intact, speech is clear, movements are goal oriented  Skin: Skin is warm and dry.  Psychiatric: She has a normal mood and affect. Her behavior is normal. Judgment and thought content normal.  Nursing note and vitals reviewed.   ED Course  Procedures (including critical care time)  Imaging Review Ct Head Wo Contrast  01/07/2015  CLINICAL DATA:  Sudden onset of severe headache. EXAM: CT HEAD WITHOUT CONTRAST TECHNIQUE: Contiguous axial images were obtained from the base of the skull through the vertex without intravenous contrast. COMPARISON:  None. FINDINGS: No acute intracranial abnormality. Specifically, no hemorrhage, hydrocephalus, mass lesion, acute infarction, or significant intracranial injury. No acute calvarial abnormality. Visualized paranasal sinuses and mastoids clear. Orbital soft tissues unremarkable. IMPRESSION: Negative Electronically Signed   By: Rolm Baptise M.D.   On: 01/07/2015 09:50   I have personally reviewed and evaluated these images and lab results as part of my medical decision-making.    MDM   Final diagnoses:  Acute nonintractable headache, unspecified headache type    Patient with sudden onset severe headache.  Symptoms started about 30 minutes ago.  No history of the same.   9:53 AM CT head is negative.  Discussed with Dr. Alvino Chapel, who agrees that no further workup indicated as patient is improving, is neurovascularly intact, and CT done  within 6 hours of onset.  Will DC to home with PCP follow-up in 3 days.    Montine Circle, PA-C 01/07/15 [place], MD 01/08/15 [phone]

## 2015-01-07 NOTE — ED Notes (Signed)
Pt transported with RN to CT.

## 2015-01-14 ENCOUNTER — Ambulatory Visit: Payer: 59 | Attending: Family Medicine | Admitting: Physical Therapy

## 2015-01-14 DIAGNOSIS — M25552 Pain in left hip: Secondary | ICD-10-CM | POA: Diagnosis present

## 2015-01-14 DIAGNOSIS — R269 Unspecified abnormalities of gait and mobility: Secondary | ICD-10-CM | POA: Insufficient documentation

## 2015-01-14 DIAGNOSIS — M62838 Other muscle spasm: Secondary | ICD-10-CM | POA: Diagnosis present

## 2015-01-14 NOTE — Therapy (Signed)
[place], [place], [address] Phone: [phone]   Fax:  [phone]  Physical Therapy Evaluation  Patient Details  Name: Lori Walsh MRN: [id number] Date of Birth: [birth year] Referring Provider: Gregor Hams  Encounter Date: 01/14/2015      PT End of Session - 01/14/15 1309    Visit Number 1   Number of Visits 12   Date for PT Re-Evaluation 02/25/15   PT Start Time 3329   PT Stop Time 1230   PT Time Calculation (min) 45 min   Activity Tolerance Patient tolerated treatment well   Behavior During Therapy [facility] for tasks assessed/performed      Past Medical History  Diagnosis Date  . Menopause   . Gestational diabetes   . Stress reaction     WITH DEPRESSION AND SLEEP DISORDER  . Fibroadenoma of right breast   . Thyroid nodule   . Hypertension   . Hyperlipidemia   . GDM (gestational diabetes mellitus)     Past Surgical History  Procedure Laterality Date  . Btl  1987  . Tubalplasty  1981  . Colonoscopy  2007  . Fna of thyroid       ABOUT 20 YEARS AGO    There were no vitals filed for this visit.  Visit Diagnosis:  Left hip pain - Plan: PT plan of care cert/re-cert  Muscle spasm - Plan: PT plan of care cert/re-cert  Abnormality of gait - Plan: PT plan of care cert/re-cert      Subjective Assessment - 01/14/15 1154    Subjective pt is a 59 y.o F with CC L lateral hip pain that has been going on for the last 6 weeks with insidoius onset. pt reports she thinks it is from doing increased activity. since onset she reports the pain is worse, and got an injection in the hip whihc helps but reports muscle spasm in the hip   Limitations Standing;Walking;Sitting;Lifting   How long can you sit comfortably? unlimited   How long can you stand comfortably? 10-25 min   How long can you walk comfortably? 10 min   Diagnostic tests 01/03/2015 Mild degenerative change of the hips.   Patient Stated  Goals get back to walking and climbing the stairs without pain or problems   Currently in Pain? Yes   Pain Score 4    Pain Location Hip   Pain Orientation Left   Pain Descriptors / Indicators Guarding   Pain Type Chronic pain   Pain Onset More than a month ago   Pain Frequency Intermittent   Aggravating Factors  prolong walking/ standing, climbing stairs, laying on the L side, bridging   Pain Relieving Factors heating pad, Ibuprofen and rest            [facility] PT Assessment - 01/14/15 1200    Assessment   Medical Diagnosis left hip bursitis    Referring Provider Gregor Hams   Onset Date/Surgical Date --  last week of September 2016   Hand Dominance Right   Next MD Visit 01/20/2015   Prior Therapy yes  Right shoulder   Precautions   Precautions None   Restrictions   Weight Bearing Restrictions No   Balance Screen   Has the patient fallen in the past 6 months No   Has the patient had a decrease in activity level because of a fear of falling?  No   Is the patient reluctant to leave their home because  of a fear of falling?  No   Home Environment   Living Environment Private residence   Living Arrangements Spouse/significant other   Available Help at Discharge Available PRN/intermittently;Available 24 hours/day   Type of Home House   Home Access Stairs to enter   Entrance Stairs-Number of Steps 3   Entrance Stairs-Rails Can reach both   Home Layout Two level   Alternate Level Stairs-Number of Steps 12   Alternate Level Stairs-Rails Right   Home Equipment Cane - single point   Prior Function   Level of Independence Independent;Independent with basic ADLs   Vocation Full time employment;Other (comment)  [employer]   Vocation Requirements sitting a microscope, prolonged sitting, walking   Leisure camping, hiking, walking dog   Cognition   Overall Cognitive Status Within Functional Limits for tasks assessed   Observation/Other Assessments   Other Surveys   Other Surveys   Lower Extremity Functional Scale  28/80  65% limited   Posture/Postural Control   Posture/Postural Control Postural limitations   ROM / Strength   AROM / PROM / Strength AROM;Strength   AROM   AROM Assessment Site Hip;Lumbar   Right/Left Hip Right;Left   Right Hip Flexion 115   Left Hip Flexion 115   Lumbar Flexion 110  at endrange   Lumbar Extension 20  pain during movement   Lumbar - Right Side Bend 40   Lumbar - Left Side Bend 30  pain at end range    Strength   Strength Assessment Site Hip;Knee   Right/Left Hip Right;Left   Right Hip Flexion 4/5   Right Hip Extension 4+/5   Right Hip ABduction 4+/5   Right Hip ADduction 4+/5   Left Hip Flexion 4/5  pain during testing   Left Hip Extension 4+/5   Left Hip ABduction 4+/5   Left Hip ADduction 4+/5   Right/Left Knee Right;Left   Right Knee Flexion 4+/5   Right Knee Extension 4+/5   Left Knee Flexion 4+/5   Left Knee Extension 4+/5   Palpation   Palpation comment tendnerness lcoated along the gluteus medius/ minimus/ and piriformis.    Special Tests    Special Tests Hip Special Tests   Hip Special Tests  Piriformis Test   Piriformis Test   Findings Positive   Side  Left   Ambulation/Gait   Gait Pattern Step-through pattern;Decreased stance time - left;Decreased step length - right;Antalgic;Lateral trunk lean to right                           PT Education - 01/14/15 1308    Education provided Yes   Education Details evaluation findings, POC, goals, HEP   Person(s) Educated Patient   Methods Explanation   Comprehension Verbalized understanding          PT Short Term Goals - 01/14/15 1314    PT SHORT TERM GOAL #1   Title pt will be I with inital HEP (02/01/2015)   Time 3   Period Weeks   Status New   PT SHORT TERM GOAL #2   Title pt will be able to verbalize and demonstrate techniques to reduce L hip pain and reinjury via postural awareness, lifting and carrying  mechanics, and HEP (02/01/2015)   Time 3   Period Weeks   Status New           PT Long Term Goals - 01/14/15 1315    PT LONG TERM GOAL #1  Title pt will be I with all HEP as of last visit ( 02/25/2015)   Time 6   Period Weeks   Status New   PT LONG TERM GOAL #2   Title pt wil demonstrate decreased muscle spasm in the glute to assit with decreased pain to < 3/10 ( 02/25/2015)   Time 6   Period Weeks   Status New   PT LONG TERM GOAL #3   Title pt will be able to stand/ walk for > 30  minutes with < 2/10 pain to assist with work related tasks (02/25/2015)   Time 6   Period Weeks   Status New   PT LONG TERM GOAL #4   Title Pt will be able ot navigate up/down > 15 steps with 2/10 pain to assist with ADLs and work related requirments (02/25/2015)   Time 6   Period Weeks   Status New   PT LONG TERM GOAL #5   Title pt will increase her LEFS score by > 10 points to demonstrate improved function at discharge ( 02/25/2015)   Time [place] - 01/14/15 1309    Clinical Impression Statement Fredericka reports to OPPT with CC of L hip pain that has been going on since the last week of september. She demonstrates full trunk and hip mobility with pain noted ruing movement. MMT was [facility] with pain during testing for hip flexion. Palpation revealed pain in the glute medius/minimi and piriformis with pain down that referred down her LLE. she she would benefit from physical therapy to decrease pain and return to PLOF by addressing the impairments listed.    Pt will benefit from skilled therapeutic intervention in order to improve on the following deficits Pain;Improper body mechanics;Postural dysfunction;Increased muscle spasms;Decreased activity tolerance;Decreased endurance   Rehab Potential Good   PT Frequency 2x / week   PT Duration 6 weeks   PT Treatment/Interventions ADLs/Self Care Home Management;Iontophoresis 4mg /ml Dexamethasone;Moist  Heat;Electrical Stimulation;Cryotherapy;Ultrasound;Therapeutic exercise;Therapeutic activities;Dry needling;Manual techniques;Passive range of motion;Patient/family education   PT Next Visit Plan assess response to HEP, dry needling if consenting, hip stretching, modalities PRN   PT Home Exercise Plan standing IT band stretching, piriformis stretch in supine and sitting, and single knee to chest   Consulted and Agree with Plan of Care Patient         Problem List Patient Active Problem List   Diagnosis Date Noted  . Buttock pain 01/03/2015  . Greater trochanteric bursitis of left hip 01/01/2015  . Leg length discrepancy 01/01/2015  . Chest pain of uncertain etiology 77/93/9030  . Family history of ischemic heart disease 04/11/2014  . Pre-diabetes 07/13/2012   Starr Lake PT, DPT, LAT, ATC  01/14/2015  1:21 PM    [facility] [address] [place], [place], [address] Phone: [phone]   Fax:  [phone]  Name: RAIHANA BALDERRAMA MRN: [id number] Date of Birth: [birth year]

## 2015-01-14 NOTE — Patient Instructions (Signed)
   Akosua Constantine PT, DPT, LAT, ATC  [facility] Phone: [phone]     

## 2015-01-16 ENCOUNTER — Ambulatory Visit: Payer: 59 | Admitting: Physical Therapy

## 2015-01-16 DIAGNOSIS — M62838 Other muscle spasm: Secondary | ICD-10-CM

## 2015-01-16 DIAGNOSIS — R269 Unspecified abnormalities of gait and mobility: Secondary | ICD-10-CM

## 2015-01-16 DIAGNOSIS — M25552 Pain in left hip: Secondary | ICD-10-CM

## 2015-01-16 NOTE — Therapy (Signed)
[place], [place], [address] Phone: [phone]   Fax:  [phone]  Physical Therapy Treatment  Patient Details  Name: Lori Walsh MRN: [id number] Date of Birth: [birth year] Referring Provider: Gregor Hams  Encounter Date: 01/16/2015      PT End of Session - 01/16/15 1145    Visit Number 2   Number of Visits 12   Date for PT Re-Evaluation 02/25/15   PT Start Time 1100   PT Stop Time 1200   PT Time Calculation (min) 60 min   Activity Tolerance Patient tolerated treatment well   Behavior During Therapy [facility] for tasks assessed/performed      Past Medical History  Diagnosis Date  . Menopause   . Gestational diabetes   . Stress reaction     WITH DEPRESSION AND SLEEP DISORDER  . Fibroadenoma of right breast   . Thyroid nodule   . Hypertension   . Hyperlipidemia   . GDM (gestational diabetes mellitus)     Past Surgical History  Procedure Laterality Date  . Btl  1987  . Tubalplasty  1981  . Colonoscopy  2007  . Fna of thyroid       ABOUT 20 YEARS AGO    There were no vitals filed for this visit.  Visit Diagnosis:  Left hip pain  Muscle spasm  Abnormality of gait      Subjective Assessment - 01/16/15 1114    Subjective " I am feeling better since the last visit but only a little" she reports that she is being consistent with her HEP.    Currently in Pain? Yes   Pain Score 3    Pain Location Hip   Pain Orientation Left   Pain Descriptors / Indicators Guarding   Pain Type Chronic pain   Pain Onset More than a month ago   Pain Frequency Intermittent                         [facility] Adult PT Treatment/Exercise - 01/16/15 0001    Self-Care   Self-Care Other Self-Care Comments   Other Self-Care Comments  reviewed HEP and instructed on proper form   Knee/Hip Exercises: Stretches   ITB Stretch 2 reps;30 seconds   Other Knee/Hip Stretches piriformis stretc 2 x 30 sec    Knee/Hip Exercises: Aerobic   Nustep L 5 x 5 min   Knee/Hip Exercises: Supine   Other Supine Knee/Hip Exercises clam shells x 15   red theraband   Modalities   Modalities Moist Heat   Moist Heat Therapy   Number Minutes Moist Heat 10 Minutes   Moist Heat Location Hip  in R sidelying   Manual Therapy   Manual Therapy Soft tissue mobilization   Manual therapy comments instrument assisted STM over the Glute mediusm/ minimus and piriformis          Trigger Point Dry Needling - 01/16/15 1115    Consent Given? Yes   Education Handout Provided Yes   Muscles Treated Upper Body Gluteus maximus;Gluteus minimus  medius               PT Education - 01/16/15 1145    Education provided Yes   Education Details HEP review, Dry needling education   Person(s) Educated Patient   Methods Explanation   Comprehension Verbalized understanding          PT Short Term Goals - 01/14/15 1314  PT SHORT TERM GOAL #1   Title pt will be I with inital HEP (02/01/2015)   Time 3   Period Weeks   Status New   PT SHORT TERM GOAL #2   Title pt will be able to verbalize and demonstrate techniques to reduce L hip pain and reinjury via postural awareness, lifting and carrying mechanics, and HEP (02/01/2015)   Time 3   Period Weeks   Status New           PT Long Term Goals - 01/14/15 1315    PT LONG TERM GOAL #1   Title pt will be I with all HEP as of last visit ( 02/25/2015)   Time 6   Period Weeks   Status New   PT LONG TERM GOAL #2   Title pt wil demonstrate decreased muscle spasm in the glute to assit with decreased pain to < 3/10 ( 02/25/2015)   Time 6   Period Weeks   Status New   PT LONG TERM GOAL #3   Title pt will be able to stand/ walk for > 30  minutes with < 2/10 pain to assist with work related tasks (02/25/2015)   Time 6   Period Weeks   Status New   PT LONG TERM GOAL #4   Title Pt will be able ot navigate up/down > 15 steps with 2/10 pain to assist with ADLs  and work related requirments (02/25/2015)   Time 6   Period Weeks   Status New   PT LONG TERM GOAL #5   Title pt will increase her LEFS score by > 10 points to demonstrate improved function at discharge ( 02/25/2015)   Time [place] - 01/16/15 1146    Clinical Impression Statement Prue reports that she has been consistent with her HEP and has noticed some relief of pain. She provided consent for dry needling of the L glute medius/ minimus and pirifmoris where mutliple twitches were palpable and she reported relief of tension following. she states the instrument assisted STM helped decrease soem pain and was able to perofrm all exercises without complaint of increased pain or soreness.    PT Next Visit Plan assess response to dry needling, hip stretching, STW for hip, modalities PRN   PT Home Exercise Plan HEP review        Problem List Patient Active Problem List   Diagnosis Date Noted  . Buttock pain 01/03/2015  . Greater trochanteric bursitis of left hip 01/01/2015  . Leg length discrepancy 01/01/2015  . Chest pain of uncertain etiology 123XX123  . Family history of ischemic heart disease 04/11/2014  . Pre-diabetes 07/13/2012    Starr Lake 01/16/2015, 12:03 PM  [facility] [address] [place], [place], [address] Phone: [phone]   Fax:  [phone]  Name: DETRICK DINUCCI MRN: [id number] Date of Birth: 08/18/1955

## 2015-01-20 ENCOUNTER — Ambulatory Visit: Payer: 59 | Admitting: Family Medicine

## 2015-01-23 ENCOUNTER — Ambulatory Visit: Payer: 59 | Admitting: Physical Therapy

## 2015-01-23 VITALS — BP 128/74

## 2015-01-23 DIAGNOSIS — M62838 Other muscle spasm: Secondary | ICD-10-CM

## 2015-01-23 DIAGNOSIS — M25552 Pain in left hip: Secondary | ICD-10-CM

## 2015-01-23 DIAGNOSIS — R269 Unspecified abnormalities of gait and mobility: Secondary | ICD-10-CM

## 2015-01-23 NOTE — Therapy (Signed)
Lori Walsh, [place], [address] Phone: [phone]   Fax:  [phone]  Physical Therapy Treatment  Patient Details  Name: Lori Walsh MRN: [id number] Date of Birth: February 28, 1956 Referring Provider: Gregor Hams  Encounter Date: 01/23/2015      PT End of Session - 01/23/15 1210    Visit Number 3   Number of Visits 12   Date for PT Re-Evaluation 02/25/15   PT Start Time 1100   PT Stop Time 1155   PT Time Calculation (min) 55 min   Activity Tolerance Patient tolerated treatment well   Behavior During Therapy [facility] for tasks assessed/performed      Past Medical History  Diagnosis Date  . Menopause   . Gestational diabetes   . Stress reaction     WITH DEPRESSION AND SLEEP DISORDER  . Fibroadenoma of right breast   . Thyroid nodule   . Hypertension   . Hyperlipidemia   . GDM (gestational diabetes mellitus)     Past Surgical History  Procedure Laterality Date  . Btl  1987  . Tubalplasty  1981  . Colonoscopy  2007  . Fna of thyroid       ABOUT 20 YEARS AGO    Filed Vitals:   01/23/15 1107  BP: 128/74    Visit Diagnosis:  Left hip pain  Muscle spasm  Abnormality of gait      Subjective Assessment - 01/23/15 1107    Subjective "i really feel like its better, the dry needling really helped alot"   Currently in Pain? Yes   Pain Score 2    Pain Location Hip   Pain Orientation Left   Pain Frequency Intermittent                         [facility] Adult PT Treatment/Exercise - 01/23/15 0001    Knee/Hip Exercises: Stretches   ITB Stretch 2 reps;30 seconds   Other Knee/Hip Stretches piriformis stretc 2 x 30 sec   Knee/Hip Exercises: Aerobic   Nustep L 5 x 8 min   Knee/Hip Exercises: Supine   Bridges with Ball Squeeze AROM;Strengthening;2 sets;10 reps   Knee/Hip Exercises: Sidelying   Hip ABduction AROM;Strengthening;Left;15 reps;2 sets   Modalities   Modalities  Electrical Stimulation   Moist Heat Therapy   Number Minutes Moist Heat 10 Minutes   Moist Heat Location Hip  in right sidelying   Electrical Stimulation   Electrical Stimulation Location L lateral hip   Electrical Stimulation Action IFC   Electrical Stimulation Parameters 100% scan, 10 min L 9   Electrical Stimulation Goals Pain   Manual Therapy   Manual therapy comments instrument assisted STM over the Glute mediusm/ minimus and piriformis          Trigger Point Dry Needling - 01/23/15 1122    Consent Given? Yes   Education Handout Provided Yes   Muscles Treated Lower Body Gluteus maximus;Gluteus minimus  medius   Gluteus Maximus Response Twitch response elicited;Palpable increased muscle length   Gluteus Minimus Response Twitch response elicited;Palpable increased muscle length              PT Education - 01/23/15 1209    Education provided Yes   Education Details e-stim education    Person(s) Educated Patient   Methods Explanation   Comprehension Verbalized understanding          PT Short Term Goals - 01/14/15 1314  PT SHORT TERM GOAL #1   Title pt will be I with inital HEP (02/01/2015)   Time 3   Period Weeks   Status New   PT SHORT TERM GOAL #2   Title pt will be able to verbalize and demonstrate techniques to reduce L hip pain and reinjury via postural awareness, lifting and carrying mechanics, and HEP (02/01/2015)   Time 3   Period Weeks   Status New           PT Long Term Goals - 01/14/15 1315    PT LONG TERM GOAL #1   Title pt will be I with all HEP as of last visit ( 02/25/2015)   Time 6   Period Weeks   Status New   PT LONG TERM GOAL #2   Title pt wil demonstrate decreased muscle spasm in the glute to assit with decreased pain to < 3/10 ( 02/25/2015)   Time 6   Period Weeks   Status New   PT LONG TERM GOAL #3   Title pt will be able to stand/ walk for > 30  minutes with < 2/10 pain to assist with work related tasks (02/25/2015)    Time 6   Period Weeks   Status New   PT LONG TERM GOAL #4   Title Pt will be able ot navigate up/down > 15 steps with 2/10 pain to assist with ADLs and work related requirments (02/25/2015)   Time 6   Period Weeks   Status New   PT LONG TERM GOAL #5   Title pt will increase her LEFS score by > 10 points to demonstrate improved function at discharge ( 02/25/2015)   Time [place] - 01/23/15 1242    Clinical Impression Statement Lori Walsh states that she is doing better today but still has some pain in the hip. She requested to check her BP due to being higher when she checked at home, which the values were WNL. She reported mulitpl twitches during the dry needling and some relief following. she was able to finish the remaining exercises without compliant. opted to do heat and E-stim to decrease any residual pain/ tightness. at the end of the session she was able to stand/ walk without any pain.    PT Next Visit Plan assess response to dry needling, hip stretching, STW for hip, modalities PRN   Consulted and Agree with Plan of Care Patient        Problem List Patient Active Problem List   Diagnosis Date Noted  . Buttock pain 01/03/2015  . Greater trochanteric bursitis of left hip 01/01/2015  . Leg length discrepancy 01/01/2015  . Chest pain of uncertain etiology 123XX123  . Family history of ischemic heart disease 04/11/2014  . Pre-diabetes 07/13/2012   Starr Lake PT, DPT, LAT, ATC  01/23/2015  12:46 PM     [facility] [address] [place], [place], [address] Phone: [phone]   Fax:  [phone]  Name: CARLEA LUMPKINS MRN: [id number] Date of Birth: 13-Mar-1955

## 2015-01-27 ENCOUNTER — Ambulatory Visit: Payer: 59

## 2015-01-28 ENCOUNTER — Ambulatory Visit: Payer: 59 | Admitting: Physical Therapy

## 2015-01-28 DIAGNOSIS — M62838 Other muscle spasm: Secondary | ICD-10-CM

## 2015-01-28 DIAGNOSIS — M25552 Pain in left hip: Secondary | ICD-10-CM | POA: Diagnosis not present

## 2015-01-28 NOTE — Therapy (Addendum)
[place], [place], [address] Phone: [phone]   Fax:  [phone]  Physical Therapy Treatment  Patient Details  Name: Lori Walsh MRN: [id number] Date of Birth: 1955/10/25 Referring Provider: Gregor Hams  Encounter Date: 01/28/2015      PT End of Session - 02/03/15 1111    Visit Number 4   Number of Visits 12   Date for PT Re-Evaluation 02/25/15   PT Start Time [id number]   PT Stop Time [id number]   PT Time Calculation (min) 37 min     Session performed on 01/28/15.   Past Medical History  Diagnosis Date  . Menopause   . Gestational diabetes   . Stress reaction     WITH DEPRESSION AND SLEEP DISORDER  . Fibroadenoma of right breast   . Thyroid nodule   . Hypertension   . Hyperlipidemia   . GDM (gestational diabetes mellitus)     Past Surgical History  Procedure Laterality Date  . Btl  1987  . Tubalplasty  1981  . Colonoscopy  2007  . Fna of thyroid       ABOUT 20 YEARS AGO    There were no vitals filed for this visit.  Visit Diagnosis:  Left hip pain  Muscle spasm      Subjective Assessment - 01/28/15 1642    Subjective Doing better, worse mostly after sitting for a while and then getting up. Can tell that things are getting better.    Currently in Pain? Yes   Pain Score 2    Pain Location Hip   Pain Orientation Left   Pain Descriptors / Indicators Sore;Tender                         [facility] Adult PT Treatment/Exercise - 01/28/15 0001    Knee/Hip Exercises: Stretches   Other Knee/Hip Stretches single knee to chest 2 X 30 second stretch   Other Knee/Hip Stretches piriformis stretc 1 x 30 sec   Knee/Hip Exercises: Sidelying   Clams Lt 1 X10  verbal and tactile cues needed.    Ultrasound   Ultrasound Location Lt gluteal region in sidelying   Ultrasound Parameters 1.5 W/cm2, 100%, 1 MHz.   Ultrasound Goals Pain;Other (Comment)  decrease muscle guarding   Manual  Therapy   Manual Therapy Soft tissue mobilization   Manual therapy comments STM to piriformis, inferior glute max (superior to ischial tuberosity)   Soft tissue mobilization multiple trigger points throughout.                PT Education - 01/28/15 1652    Education provided Yes   Education Details Hip abductor weakness and gait.    Person(s) Educated Patient   Methods Explanation   Comprehension Verbalized understanding          PT Short Term Goals - 01/14/15 1314    PT SHORT TERM GOAL #1   Title pt will be I with inital HEP (02/01/2015)   Time 3   Period Weeks   Status New   PT SHORT TERM GOAL #2   Title pt will be able to verbalize and demonstrate techniques to reduce L hip pain and reinjury via postural awareness, lifting and carrying mechanics, and HEP (02/01/2015)   Time 3   Period Weeks   Status New           PT Long Term Goals - 01/14/15 1315  PT LONG TERM GOAL #1   Title pt will be I with all HEP as of last visit ( 02/25/2015)   Time 6   Period Weeks   Status New   PT LONG TERM GOAL #2   Title pt wil demonstrate decreased muscle spasm in the glute to assit with decreased pain to < 3/10 ( 02/25/2015)   Time 6   Period Weeks   Status New   PT LONG TERM GOAL #3   Title pt will be able to stand/ walk for > 30  minutes with < 2/10 pain to assist with work related tasks (02/25/2015)   Time 6   Period Weeks   Status New   PT LONG TERM GOAL #4   Title Pt will be able ot navigate up/down > 15 steps with 2/10 pain to assist with ADLs and work related requirments (02/25/2015)   Time 6   Period Weeks   Status New   PT LONG TERM GOAL #5   Title pt will increase her LEFS score by > 10 points to demonstrate improved function at discharge ( 02/25/2015)   Time [place] - 01/28/15 1653    Clinical Impression Statement Patient running late for session, limited available time. Noted multiple trigger points  along piriformis region and superior to ischial tuberosity. Starting clam exercise (patient declined handout for reference), significant weakness noted in hip abductors.    PT Frequency 2x / week   PT Next Visit Plan Modalities/manual therapy as needed, check and progress clam exercise.   PT Home Exercise Plan Check piriformis stretch, hooklying and seated. Review and progress hip abductor strengthening.    Consulted and Agree with Plan of Care Patient        Problem List Patient Active Problem List   Diagnosis Date Noted  . Buttock pain 01/03/2015  . Greater trochanteric bursitis of left hip 01/01/2015  . Leg length discrepancy 01/01/2015  . Chest pain of uncertain etiology 123XX123  . Family history of ischemic heart disease 04/11/2014  . Pre-diabetes 07/13/2012    Wyatt Haste, CSCS 01/28/2015, 5:03 PM  [facility] [address] [place], [place], [address] Phone: [phone]   Fax:  [phone]  Name: Lori Walsh MRN: [id number] Date of Birth: [birth year]

## 2015-01-29 ENCOUNTER — Ambulatory Visit: Payer: 59 | Admitting: Family Medicine

## 2015-02-04 ENCOUNTER — Ambulatory Visit: Payer: 59 | Admitting: Physical Therapy

## 2015-02-04 DIAGNOSIS — R269 Unspecified abnormalities of gait and mobility: Secondary | ICD-10-CM

## 2015-02-04 DIAGNOSIS — M62838 Other muscle spasm: Secondary | ICD-10-CM

## 2015-02-04 DIAGNOSIS — M25552 Pain in left hip: Secondary | ICD-10-CM

## 2015-02-04 NOTE — Therapy (Signed)
[place], [place], [address] Phone: [phone]   Fax:  [phone]  Physical Therapy Treatment  Patient Details  Name: Lori Walsh MRN: [id number] Date of Birth: [birth year] Referring Provider: Gregor Hams  Encounter Date: 02/04/2015      PT End of Session - 02/04/15 1237    Visit Number 8   Number of Visits 12   Date for PT Re-Evaluation 02/25/15   PT Start Time 0930   PT Stop Time 1020   PT Time Calculation (min) 50 min   Activity Tolerance Patient tolerated treatment well   Behavior During Therapy [facility] for tasks assessed/performed      Past Medical History  Diagnosis Date  . Menopause   . Gestational diabetes   . Stress reaction     WITH DEPRESSION AND SLEEP DISORDER  . Fibroadenoma of right breast   . Thyroid nodule   . Hypertension   . Hyperlipidemia   . GDM (gestational diabetes mellitus)     Past Surgical History  Procedure Laterality Date  . Btl  1987  . Tubalplasty  1981  . Colonoscopy  2007  . Fna of thyroid       ABOUT 20 YEARS AGO    There were no vitals filed for this visit.  Visit Diagnosis:  Left hip pain  Muscle spasm  Abnormality of gait      Subjective Assessment - 02/04/15 0934    Subjective "I must have done too much over the weekend she reports doing too much and had increased soreness but it got better over the last 2 days" she reports some incrased in pain down the RLE   Currently in Pain? Yes   Pain Score 1    Pain Location Hip   Pain Orientation Left   Pain Descriptors / Indicators Sore   Pain Type Chronic pain   Pain Frequency Intermittent   Aggravating Factors  prlonged walking, overuse   Pain Relieving Factors heating pad, ibuprofen and rest                         [facility] Adult PT Treatment/Exercise - 02/04/15 0943    Knee/Hip Exercises: Stretches   ITB Stretch Left;2 reps;30 seconds   Piriformis Stretch Left;2 reps;30  seconds   Other Knee/Hip Stretches single knee to chest 2 X 30 second stretch   Knee/Hip Exercises: Aerobic   Nustep L7 x 9 min   Knee/Hip Exercises: Supine   Other Supine Knee/Hip Exercises clam shells x 15   green theraband   Knee/Hip Exercises: Prone   Other Prone Exercises quadruped mule kicks 2 x 10 with LLE only   Electrical Stimulation   Electrical Stimulation Location L lateral hip   Electrical Stimulation Action IFC   Electrical Stimulation Parameters 100% scan, 10 min, L 12   Electrical Stimulation Goals Pain   Manual Therapy   Manual Therapy Myofascial release   Manual therapy comments manual trigger point release of the glute medius/ minius/ and maximus   Soft tissue mobilization DTM over the Glute Medius/minimus/ maximus                PT Education - 02/04/15 1227    Education provided Yes   Education Details progress strengthening of hip abductors.    Person(s) Educated Patient   Methods Explanation   Comprehension Verbalized understanding          PT Short Term Goals -  02/04/15 1242    PT SHORT TERM GOAL #1   Title pt will be I with inital HEP (02/01/2015)   Time 3   Period Weeks   Status On-going   PT SHORT TERM GOAL #2   Title pt will be able to verbalize and demonstrate techniques to reduce L hip pain and reinjury via postural awareness, lifting and carrying mechanics, and HEP (02/01/2015)   Time 3   Period Weeks   Status On-going           PT Long Term Goals - 02/04/15 1242    PT LONG TERM GOAL #1   Title pt will be I with all HEP as of last visit ( 02/25/2015)   Time 6   Period Weeks   Status On-going   PT LONG TERM GOAL #2   Title pt wil demonstrate decreased muscle spasm in the glute to assit with decreased pain to < 3/10 ( 02/25/2015)   Time 6   Period Weeks   Status On-going   PT LONG TERM GOAL #3   Title pt will be able to stand/ walk for > 30  minutes with < 2/10 pain to assist with work related tasks (02/25/2015)   Time  6   Period Weeks   Status On-going   PT LONG TERM GOAL #4   Title Pt will be able ot navigate up/down > 15 steps with 2/10 pain to assist with ADLs and work related requirments (02/25/2015)   Time 6   Period Weeks   Status On-going   PT LONG TERM GOAL #5   Title pt will increase her LEFS score by > 10 points to demonstrate improved function at discharge ( 02/25/2015)   Time 6   Period Weeks   Status On-going               Plan - 02/04/15 1238    Clinical Impression Statement Pamilla reports some increased pain in the hip over the weekend which was possibly due to doing too much on Friday. Today she reported feeling better an that she is able to do more without as much pain in L posterior hip. Focused todays treament on manual STM, and strengthening of the hip abductors/ extensors. Plan to progress hip and knee strengthening with open/ Closed chain activities.    PT Next Visit Plan Modalities/manual therapy as needed, check and progress clam exercise hip/ knee strengthening.    Consulted and Agree with Plan of Care Patient        Problem List Patient Active Problem List   Diagnosis Date Noted  . Buttock pain 01/03/2015  . Greater trochanteric bursitis of left hip 01/01/2015  . Leg length discrepancy 01/01/2015  . Chest pain of uncertain etiology 123XX123  . Family history of ischemic heart disease 04/11/2014  . Pre-diabetes 07/13/2012   Starr Lake PT, DPT, LAT, ATC  02/04/2015  12:49 PM     [place] [facility] [address] [place], [place], [address] Phone: [phone]   Fax:  [phone]  Name: DYNASTIE Walsh MRN: [id number] Date of Birth: [birth year]

## 2015-02-07 ENCOUNTER — Ambulatory Visit: Payer: 59 | Admitting: Physical Therapy

## 2015-02-10 ENCOUNTER — Ambulatory Visit: Payer: 59 | Admitting: Family Medicine

## 2015-02-10 ENCOUNTER — Encounter: Payer: 59 | Admitting: Physical Therapy

## 2015-02-12 ENCOUNTER — Ambulatory Visit: Payer: 59 | Attending: Family Medicine | Admitting: Physical Therapy

## 2015-02-12 DIAGNOSIS — R269 Unspecified abnormalities of gait and mobility: Secondary | ICD-10-CM | POA: Diagnosis present

## 2015-02-12 DIAGNOSIS — M62838 Other muscle spasm: Secondary | ICD-10-CM | POA: Insufficient documentation

## 2015-02-12 DIAGNOSIS — M25552 Pain in left hip: Secondary | ICD-10-CM | POA: Diagnosis not present

## 2015-02-12 NOTE — Therapy (Signed)
[place], [place], [address] Phone: [phone]   Fax:  [phone]  Physical Therapy Treatment  Patient Details  Name: Lori Walsh MRN: [id number] Date of Birth: April 05, 1955 Referring Provider: Gregor Hams  Encounter Date: 02/12/2015      PT End of Session - 02/12/15 0941    Visit Number 9   Number of Visits 12   Date for PT Re-Evaluation 02/25/15   PT Start Time 0845   PT Stop Time 0950   PT Time Calculation (min) 65 min   Activity Tolerance Patient tolerated treatment well   Behavior During Therapy [facility] for tasks assessed/performed      Past Medical History  Diagnosis Date  . Menopause   . Gestational diabetes   . Stress reaction     WITH DEPRESSION AND SLEEP DISORDER  . Fibroadenoma of right breast   . Thyroid nodule   . Hypertension   . Hyperlipidemia   . GDM (gestational diabetes mellitus)     Past Surgical History  Procedure Laterality Date  . Btl  1987  . Tubalplasty  1981  . Colonoscopy  2007  . Fna of thyroid       ABOUT 20 YEARS AGO    There were no vitals filed for this visit.  Visit Diagnosis:  Left hip pain  Muscle spasm  Abnormality of gait      Subjective Assessment - 02/12/15 0851    Subjective "I am doing well and haven't had as much trouble and muscle tightness and have been able to standing/ walk much longer"    Currently in Pain? No/denies   Pain Score 0-No pain   Pain Location Hip   Pain Orientation Left   Pain Descriptors / Indicators Sore   Pain Type Chronic pain   Pain Onset More than a month ago   Aggravating Factors  prolonged walking/ standing   Pain Relieving Factors heating pad,                          [facility] Adult PT Treatment/Exercise - 02/12/15 0854    Knee/Hip Exercises: Stretches   ITB Stretch Left;2 reps;30 seconds   Piriformis Stretch Left;2 reps;30 seconds   Other Knee/Hip Stretches single knee to chest 2 X 30  second stretch   Other Knee/Hip Stretches piriformis stretc 1 x 30 sec   Knee/Hip Exercises: Aerobic   Nustep L7 x 10 min   Knee/Hip Exercises: Supine   Bridges with Ball Squeeze AROM;Strengthening;Both;10 reps;1 set   Other Supine Knee/Hip Exercises clam shells x 15   green theraband   Knee/Hip Exercises: Sidelying   Clams Lt 1 X10   Knee/Hip Exercises: Prone   Other Prone Exercises quadruped mule kicks 2 x 10 with LLE only   Electrical Stimulation   Electrical Stimulation Location L lateral hip/ glute max   Electrical Stimulation Action pre mod   Electrical Stimulation Parameters continuous x 10 L 12 for both   Electrical Stimulation Goals Pain   Manual Therapy   Manual Therapy Myofascial release   Soft tissue mobilization DTM over the Glute Medius/minimus/ maximus   Myofascial Release myofascial release of glute medius/ and glute max          Trigger Point Dry Needling - 02/12/15 [id number]    Consent Given? Yes   Education Handout Provided Yes   Muscles Treated Lower Body Gluteus minimus;Gluteus maximus   Gluteus Maximus Response Twitch response  elicited;Palpable increased muscle length   Gluteus Minimus Response Twitch response elicited;Palpable increased muscle length              PT Education - 02/12/15 0941    Education provided Yes   Education Details updated HEP   Person(s) Educated Patient   Methods Explanation   Comprehension Verbalized understanding          PT Short Term Goals - 02/12/15 0946    PT SHORT TERM GOAL #1   Title pt will be I with inital HEP (02/01/2015)   Time 3   Period Weeks   Status Achieved   PT SHORT TERM GOAL #2   Title pt will be able to verbalize and demonstrate techniques to reduce L hip pain and reinjury via postural awareness, lifting and carrying mechanics, and HEP (02/01/2015)   Time 3   Period Weeks   Status Achieved           PT Long Term Goals - 02/12/15 [id number]    PT LONG TERM GOAL #1   Title pt will be I with  all HEP as of last visit ( 02/25/2015)   Time 6   Period Weeks   Status On-going   PT LONG TERM GOAL #2   Title pt wil demonstrate decreased muscle spasm in the glute to assit with decreased pain to < 3/10 ( 02/25/2015)   Time 6   Period Weeks   Status On-going   PT LONG TERM GOAL #3   Title pt will be able to stand/ walk for > 30  minutes with < 2/10 pain to assist with work related tasks (02/25/2015)   Time 6   Period Weeks   Status On-going   PT LONG TERM GOAL #4   Title Pt will be able ot navigate up/down > 15 steps with 2/10 pain to assist with ADLs and work related requirments (02/25/2015)   Time 6   Period Weeks   Status On-going   PT LONG TERM GOAL #5   Title pt will increase her LEFS score by > 10 points to demonstrate improved function at discharge ( 02/25/2015)   Time 6   Period Weeks   Status On-going               Plan - 02/12/15 0944    Clinical Impression Statement Lori Walsh continues to make progress with improved walking and standing endurance without as much pain in the hip. She continues to demonstrate tightness in the L hip musculature. she was able to do peform bridges today but reported tightness in the glute max which was better following stretching activities. She provided consent for dry needling of the glute medius/minimus and maximus. she reported relief of tightess and tension following todays session. Updated HEP to include standing hip strengthehing.         Problem List Patient Active Problem List   Diagnosis Date Noted  . Buttock pain 01/03/2015  . Greater trochanteric bursitis of left hip 01/01/2015  . Leg length discrepancy 01/01/2015  . Chest pain of uncertain etiology 123XX123  . Family history of ischemic heart disease 04/11/2014  . Pre-diabetes 07/13/2012   Lori Walsh PT, DPT, LAT, ATC  02/12/2015  9:56 AM     [facility] [address] [place], [place],  [address] Phone: [phone]   Fax:  [phone]  Name: Lori Walsh MRN: [id number] Date of Birth: 1956-02-23

## 2015-02-12 NOTE — Patient Instructions (Signed)
   Solash Tullo PT, DPT, LAT, ATC  [facility] Phone: [phone]     

## 2015-02-17 ENCOUNTER — Ambulatory Visit: Payer: 59 | Admitting: Physical Therapy

## 2015-02-17 DIAGNOSIS — M25552 Pain in left hip: Secondary | ICD-10-CM | POA: Diagnosis not present

## 2015-02-17 DIAGNOSIS — R269 Unspecified abnormalities of gait and mobility: Secondary | ICD-10-CM

## 2015-02-17 DIAGNOSIS — M62838 Other muscle spasm: Secondary | ICD-10-CM

## 2015-02-17 NOTE — Therapy (Signed)
[place], [place], [address] Phone: [phone]   Fax:  [phone]  Physical Therapy Treatment  Patient Details  Name: Lori Walsh MRN: [id number] Date of Birth: 06-17-1955 Referring Provider: Gregor Hams  Encounter Date: 02/17/2015      PT End of Session - 02/17/15 0939    Visit Number 10   Number of Visits 12   Date for PT Re-Evaluation 02/25/15   PT Start Time [id number]  pt arrived 12 min late   PT Stop Time 0935   PT Time Calculation (min) 38 min   Activity Tolerance Patient tolerated treatment well   Behavior During Therapy [facility] for tasks assessed/performed      Past Medical History  Diagnosis Date  . Menopause   . Gestational diabetes   . Stress reaction     WITH DEPRESSION AND SLEEP DISORDER  . Fibroadenoma of right breast   . Thyroid nodule   . Hypertension   . Hyperlipidemia   . GDM (gestational diabetes mellitus)     Past Surgical History  Procedure Laterality Date  . Btl  1987  . Tubalplasty  1981  . Colonoscopy  2007  . Fna of thyroid       ABOUT 20 YEARS AGO    There were no vitals filed for this visit.  Visit Diagnosis:  Left hip pain  Muscle spasm  Abnormality of gait      Subjective Assessment - 02/17/15 0901    Subjective "I am doing pretty well today, I had some tightness in the front of my other hip.    Pain Score 2    Pain Location Hip   Pain Orientation Left   Pain Descriptors / Indicators Sore   Pain Type Chronic pain   Pain Frequency Intermittent                         [facility] Adult PT Treatment/Exercise - 02/17/15 0903    Lumbar Exercises: Machines for Strengthening   Leg Press 1 x 10 with 1 plate, 2 x 10 with 2 plates   Other Lumbar Machine Exercise hip cybex 2 plates 2 x 10 abduction  bil   Tread Mill 5 min 2.0 MPH, grade Level 3   Knee/Hip Exercises: Stretches   ITB Stretch Left;2 reps;30 seconds   Piriformis Stretch Left;2  reps;30 seconds   Other Knee/Hip Stretches single knee to chest 2 X 30 second stretch   Knee/Hip Exercises: Aerobic   Nustep Treadmill L8 x 10 min X 5 min, 2.0 MPH with level 3    Knee/Hip Exercises: Supine   Bridges with Ball Squeeze AROM;Strengthening;Both;10 reps;2 sets   Manual Therapy   Soft tissue mobilization DTM over the Glute Medius/minimus/ maximus                PT Education - 02/17/15 [phone]    Education provided Yes   Education Details HEP review, discussed progress    Person(s) Educated Patient   Methods Explanation   Comprehension Verbalized understanding          PT Short Term Goals - 02/12/15 0946    PT SHORT TERM GOAL #1   Title pt will be I with inital HEP (02/01/2015)   Time 3   Period Weeks   Status Achieved   PT SHORT TERM GOAL #2   Title pt will be able to verbalize and demonstrate techniques to reduce L hip pain and  reinjury via postural awareness, lifting and carrying mechanics, and HEP (02/01/2015)   Time 3   Period Weeks   Status Achieved           PT Long Term Goals - 02/12/15 [id number]    PT LONG TERM GOAL #1   Title pt will be I with all HEP as of last visit ( 02/25/2015)   Time 6   Period Weeks   Status On-going   PT LONG TERM GOAL #2   Title pt wil demonstrate decreased muscle spasm in the glute to assit with decreased pain to < 3/10 ( 02/25/2015)   Time 6   Period Weeks   Status On-going   PT LONG TERM GOAL #3   Title pt will be able to stand/ walk for > 30  minutes with < 2/10 pain to assist with work related tasks (02/25/2015)   Time 6   Period Weeks   Status On-going   PT LONG TERM GOAL #4   Title Pt will be able ot navigate up/down > 15 steps with 2/10 pain to assist with ADLs and work related requirments (02/25/2015)   Time 6   Period Weeks   Status On-going   PT LONG TERM GOAL #5   Title pt will increase her LEFS score by > 10 points to demonstrate improved function at discharge ( 02/25/2015)   Time 6   Period  Weeks   Status On-going               Plan - 02/17/15 0940    Clinical Impression Statement Claire reported only 1 spot of sorenss in the hip which she attributes to having increased stress over the weekend. She reported no pain during all exercises today and stated she felt better following todays session with increased focus on strengthening and walking on incline at a steady pasce on the treadmill. Discussed if pt continues to demonstrate no pain or problems after today we may be discharging her in the next 1-2 visits.    PT Next Visit Plan Modalities/manual therapy as needed, check and progress clam exercise hip/ knee strengthening, treadmill at incline, transition to exercise and [facility] activities   Consulted and Agree with Plan of Care Patient        Problem List Patient Active Problem List   Diagnosis Date Noted  . Buttock pain 01/03/2015  . Greater trochanteric bursitis of left hip 01/01/2015  . Leg length discrepancy 01/01/2015  . Chest pain of uncertain etiology 123XX123  . Family history of ischemic heart disease 04/11/2014  . Pre-diabetes 07/13/2012   Starr Lake PT, DPT, LAT, ATC  02/17/2015  9:45 AM    [facility] [address] [place], [place], [address] Phone: [phone]   Fax:  [phone]  Name: GLADIES UNGAR MRN: [id number] Date of Birth: 08/13/1955

## 2015-02-19 ENCOUNTER — Ambulatory Visit: Payer: 59 | Admitting: Physical Therapy

## 2015-02-19 DIAGNOSIS — M25552 Pain in left hip: Secondary | ICD-10-CM | POA: Diagnosis not present

## 2015-02-19 DIAGNOSIS — M62838 Other muscle spasm: Secondary | ICD-10-CM

## 2015-02-19 DIAGNOSIS — R269 Unspecified abnormalities of gait and mobility: Secondary | ICD-10-CM

## 2015-02-19 NOTE — Therapy (Signed)
[place], [place], [address] Phone: [phone]   Fax:  [phone]  Physical Therapy Treatment  Patient Details  Name: Lori Walsh MRN: [id number] Date of Birth: Oct 27, 1955 Referring Provider: Gregor Hams  Encounter Date: 02/19/2015      PT End of Session - 02/19/15 1007    Visit Number 11   Number of Visits 12   Date for PT Re-Evaluation 02/25/15   PT Start Time 0852   PT Stop Time 0935   PT Time Calculation (min) 43 min   Activity Tolerance Patient tolerated treatment well   Behavior During Therapy [facility] for tasks assessed/performed      Past Medical History  Diagnosis Date  . Menopause   . Gestational diabetes   . Stress reaction     WITH DEPRESSION AND SLEEP DISORDER  . Fibroadenoma of right breast   . Thyroid nodule   . Hypertension   . Hyperlipidemia   . GDM (gestational diabetes mellitus)     Past Surgical History  Procedure Laterality Date  . Btl  1987  . Tubalplasty  1981  . Colonoscopy  2007  . Fna of thyroid       ABOUT 20 YEARS AGO    There were no vitals filed for this visit.  Visit Diagnosis:  Left hip pain  Muscle spasm  Abnormality of gait      Subjective Assessment - 02/19/15 0855    Subjective "I felt sore after the last visit but not until 24 hours after"    Currently in Pain? Yes   Pain Score 1    Pain Orientation Right   Pain Descriptors / Indicators Sore   Pain Onset More than a month ago   Pain Frequency Occasional   Aggravating Factors  prolonged standing/ walking up hill   Pain Relieving Factors heating pad, exercise                         [facility] Adult PT Treatment/Exercise - 02/19/15 0857    Self-Care   Self-Care Other Self-Care Comments   Other Self-Care Comments  measured leg length bil with the LLE being shoring by .8 CM, and educated she needs to move her heel lift from the R shoe to her left shoe.    Lumbar Exercises:  Machines for Strengthening   Leg Press 3x 10 with 2 plates   Other Lumbar Machine Exercise hip cybex 2 plates 2 x 10 abduction  bil   Knee/Hip Exercises: Stretches   ITB Stretch Left;2 reps;30 seconds  in R sidelying   Piriformis Stretch Left;2 reps;30 seconds   Other Knee/Hip Stretches single knee to chest 2 X 30 second stretch   Knee/Hip Exercises: Aerobic   Tread Mill 8 min, 2.1 MPH, grade 4   Nustep L8 x10 min   Knee/Hip Exercises: Standing   Forward Step Up 2 sets;10 reps;Step Height: 6"   Knee/Hip Exercises: Prone   Other Prone Exercises quadruped mule kicks 2 x 10 with LLE only                PT Education - 02/19/15 1007    Education provided Yes   Education Details updated HEP   Person(s) Educated Patient   Methods Explanation   Comprehension Verbalized understanding          PT Short Term Goals - 02/12/15 0946    PT SHORT TERM GOAL #1   Title pt  will be I with inital HEP (02/01/2015)   Time 3   Period Weeks   Status Achieved   PT SHORT TERM GOAL #2   Title pt will be able to verbalize and demonstrate techniques to reduce L hip pain and reinjury via postural awareness, lifting and carrying mechanics, and HEP (02/01/2015)   Time 3   Period Weeks   Status Achieved           PT Long Term Goals - 02/12/15 [id number]    PT LONG TERM GOAL #1   Title pt will be I with all HEP as of last visit ( 02/25/2015)   Time 6   Period Weeks   Status On-going   PT LONG TERM GOAL #2   Title pt wil demonstrate decreased muscle spasm in the glute to assit with decreased pain to < 3/10 ( 02/25/2015)   Time 6   Period Weeks   Status On-going   PT LONG TERM GOAL #3   Title pt will be able to stand/ walk for > 30  minutes with < 2/10 pain to assist with work related tasks (02/25/2015)   Time 6   Period Weeks   Status On-going   PT LONG TERM GOAL #4   Title Pt will be able ot navigate up/down > 15 steps with 2/10 pain to assist with ADLs and work related requirments  (02/25/2015)   Time 6   Period Weeks   Status On-going   PT LONG TERM GOAL #5   Title pt will increase her LEFS score by > 10 points to demonstrate improved function at discharge ( 02/25/2015)   Time 6   Period Weeks   Status On-going               Plan - 02/19/15 1008    Clinical Impression Statement Lauriel continues to report feeling better and able to do more with report that she walked her dog today 1/2 the distance without difficulty. She was able to complete all exercises today with no report of pain or tightness in the hip musculature. Discussed progress and that she is on track for being discharged next visit and pt agreed.    PT Next Visit Plan Modalities/manual therapy as needed, check and progress clam exercise hip/ knee strengthening, treadmill at incline, transition to exercise and [facility] activities, HEP review, assess goals, D/C   PT Home Exercise Plan lateral band walks, monster walks, and sit to stand exercises.    Consulted and Agree with Plan of Care Patient        Problem List Patient Active Problem List   Diagnosis Date Noted  . Buttock pain 01/03/2015  . Greater trochanteric bursitis of left hip 01/01/2015  . Leg length discrepancy 01/01/2015  . Chest pain of uncertain etiology 123XX123  . Family history of ischemic heart disease 04/11/2014  . Pre-diabetes 07/13/2012   Starr Lake PT, DPT, LAT, ATC  02/19/2015  10:12 AM      [facility] [address] [place], [place], [address] Phone: [phone]   Fax:  [phone]  Name: Lori Walsh MRN: [id number] Date of Birth: 16-Nov-1955

## 2015-02-19 NOTE — Patient Instructions (Signed)
   Alto Gandolfo PT, DPT, LAT, ATC  [facility] Phone: [phone]     

## 2015-03-05 ENCOUNTER — Encounter: Payer: 59 | Admitting: Physical Therapy

## 2015-03-06 ENCOUNTER — Ambulatory Visit: Payer: 59 | Admitting: Physical Therapy

## 2015-03-06 DIAGNOSIS — M25552 Pain in left hip: Secondary | ICD-10-CM | POA: Diagnosis not present

## 2015-03-06 DIAGNOSIS — R269 Unspecified abnormalities of gait and mobility: Secondary | ICD-10-CM

## 2015-03-06 DIAGNOSIS — M62838 Other muscle spasm: Secondary | ICD-10-CM

## 2015-03-06 NOTE — Therapy (Signed)
[place], [place], [address] Phone: [phone]   Fax:  [phone]  Physical Therapy Treatment / Discharge note  Patient Details  Name: Lori Walsh MRN: [id number] Date of Birth: 01-09-1956 Referring Provider: Gregor Hams  Encounter Date: 03/06/2015      PT End of Session - 03/06/15 1649    Visit Number 12   Number of Visits 12   Date for PT Re-Evaluation 02/25/15   PT Start Time 1610   PT Stop Time 1645   PT Time Calculation (min) 35 min   Activity Tolerance Patient tolerated treatment well   Behavior During Therapy [facility] for tasks assessed/performed      Past Medical History  Diagnosis Date  . Menopause   . Gestational diabetes   . Stress reaction     WITH DEPRESSION AND SLEEP DISORDER  . Fibroadenoma of right breast   . Thyroid nodule   . Hypertension   . Hyperlipidemia   . GDM (gestational diabetes mellitus)     Past Surgical History  Procedure Laterality Date  . Btl  1987  . Tubalplasty  1981  . Colonoscopy  2007  . Fna of thyroid       ABOUT 20 YEARS AGO    There were no vitals filed for this visit.  Visit Diagnosis:  Left hip pain  Muscle spasm  Abnormality of gait      Subjective Assessment - 03/06/15 1625    Subjective "I felt sore in my low back but otherwise I am doing pretty good"    Currently in Pain? No/denies   Pain Location Hip            [facility] PT Assessment - 03/06/15 1635    Observation/Other Assessments   Lower Extremity Functional Scale  65/80  81.25   AROM   Right Hip Flexion 115   Left Hip Flexion 115   Lumbar Flexion 112   Lumbar Extension 26   Lumbar - Right Side Bend 42   Lumbar - Left Side Bend 38   Strength   Right Hip Flexion 4+/5   Right Hip Extension 4/5   Right Hip ABduction 4+/5   Right Hip ADduction 4+/5   Left Hip Flexion 4+/5   Left Hip Extension 4+/5   Left Hip ABduction 4+/5   Left Hip ADduction 4+/5   Right Knee  Flexion 4+/5   Right Knee Extension 4+/5   Left Knee Flexion 4+/5   Left Knee Extension 4+/5                     [facility] Adult PT Treatment/Exercise - 03/06/15 1629    Knee/Hip Exercises: Stretches   ITB Stretch Left;2 reps;30 seconds   Piriformis Stretch Left;2 reps;30 seconds   Other Knee/Hip Stretches single knee to chest 2 X 30 second stretch   Other Knee/Hip Stretches piriformis stretc 1 x 30 sec   Knee/Hip Exercises: Aerobic   Nustep L6 x10 min                PT Education - 03/06/15 1648    Education provided Yes   Education Details HEP Review   Person(s) Educated Patient   Methods Explanation   Comprehension Verbalized understanding          PT Short Term Goals - 02/12/15 0946    PT SHORT TERM GOAL #1   Title pt will be I with inital HEP (02/01/2015)   Time 3  Period Weeks   Status Achieved   PT SHORT TERM GOAL #2   Title pt will be able to verbalize and demonstrate techniques to reduce L hip pain and reinjury via postural awareness, lifting and carrying mechanics, and HEP (02/01/2015)   Time 3   Period Weeks   Status Achieved           PT Long Term Goals - 03/06/15 1644    PT LONG TERM GOAL #1   Title pt will be I with all HEP as of last visit ( 02/25/2015)   Time 6   Period Weeks   Status Achieved   PT LONG TERM GOAL #2   Title pt wil demonstrate decreased muscle spasm in the glute to assit with decreased pain to < 3/10 ( 02/25/2015)   Time 6   Period Weeks   Status Achieved   PT LONG TERM GOAL #3   Title pt will be able to stand/ walk for > 30  minutes with < 2/10 pain to assist with work related tasks (02/25/2015)   Time 6   Period Weeks   Status Achieved   PT LONG TERM GOAL #4   Title Pt will be able ot navigate up/down > 15 steps with 2/10 pain to assist with ADLs and work related requirments (02/25/2015)   Time 6   Period Weeks   Status Achieved   PT LONG TERM GOAL #5   Title pt will increase her LEFS score by > 10  points to demonstrate improved function at discharge ( 02/25/2015)   Time 6   Period Weeks   Status Achieved               Plan - 03/06/15 1649    Clinical Impression Statement Lori Walsh has made great progress with therapy and has improved her trunk mobility and hip strength. Additionally she reports no pain in the hip and has been progressing with walking. she met all STG and LTGs. She reports that she is able to maintain and progress her current level of function independenlty and will  be discharged from physical therapy today.    PT Next Visit Plan D/C   PT Home Exercise Plan HEP review   Consulted and Agree with Plan of Care Patient        Problem List Patient Active Problem List   Diagnosis Date Noted  . Buttock pain 01/03/2015  . Greater trochanteric bursitis of left hip 01/01/2015  . Leg length discrepancy 01/01/2015  . Chest pain of uncertain etiology 24/58/0998  . Family history of ischemic heart disease 04/11/2014  . Pre-diabetes 07/13/2012   Starr Lake PT, DPT, LAT, ATC  03/06/2015  4:55 PM      [facility] [address] [place], [place], [address] Phone: [phone]   Fax:  [phone]  Name: Lori Walsh MRN: [id number] Date of Birth: Sep 14, 1955   PHYSICAL THERAPY DISCHARGE SUMMARY  Visits from Start of Care: 12  Current functional level related to goals / functional outcomes: LEFS score 65/80    Remaining deficits: Decreased walking endurance   Education / Equipment: HEP  Plan: Patient agrees to discharge.  Patient goals were met. Patient is being discharged due to meeting the stated rehab goals.  ?????

## 2015-03-12 ENCOUNTER — Ambulatory Visit: Payer: 59 | Admitting: Pharmacist

## 2015-03-28 MED FILL — LISINOPRIL 20 MG TABLET: 20 | 90 days supply | Qty: 90 | Fill #0

## 2015-03-31 DIAGNOSIS — H40013 Open angle with borderline findings, low risk, bilateral: Secondary | ICD-10-CM | POA: Diagnosis not present

## 2015-03-31 DIAGNOSIS — H2513 Age-related nuclear cataract, bilateral: Secondary | ICD-10-CM | POA: Diagnosis not present

## 2015-04-02 ENCOUNTER — Ambulatory Visit: Payer: 59 | Admitting: Pharmacist

## 2015-04-02 DIAGNOSIS — D1801 Hemangioma of skin and subcutaneous tissue: Secondary | ICD-10-CM | POA: Diagnosis not present

## 2015-04-02 DIAGNOSIS — L821 Other seborrheic keratosis: Secondary | ICD-10-CM | POA: Diagnosis not present

## 2015-04-02 DIAGNOSIS — D225 Melanocytic nevi of trunk: Secondary | ICD-10-CM | POA: Diagnosis not present

## 2015-04-03 ENCOUNTER — Encounter: Payer: Self-pay | Admitting: Pharmacist

## 2015-04-03 ENCOUNTER — Ambulatory Visit (INDEPENDENT_AMBULATORY_CARE_PROVIDER_SITE_OTHER): Payer: Self-pay | Admitting: Family Medicine

## 2015-04-03 VITALS — BP 132/76 | Wt 185.0 lb

## 2015-04-03 DIAGNOSIS — R7303 Prediabetes: Secondary | ICD-10-CM

## 2015-04-03 NOTE — Progress Notes (Signed)
Subjective:  Patient is a 59 yo female with pre-diabetes who presents today for 3 month follow-up as part of the employer-sponsored Link to Wellness program. Pt is not currently treated for diabetes.  Patient also continues on daily ASA and ACEi.  She is not tolerant of statins but is on red yeast rice daily along with fenofibrate.  Most recent MD follow-up was Oct 2016. Patient has a pending appt for 6 mo follow-up in April.  No med changes or major health changes at this time.   Diabetes Assessment:  No changes to pre-diabetes treatment at this time. Patient does maintain good medication compliance. Most recent A1c was done today via POC testing result of 5.8% (prev 5.9%) which is improved and at goal of less than 6%.  Weight has increased by 2 lbs since last visit.  Patient did bring meter today and is currently testing 2-3 times per week, fasting and post prandial.  Glucose readings are 105-160s per glucometer.  Patient denies signs and symptoms of neuropathy including numbness/tingling/burning and symptoms of foot infection.  Patient is up to date on eye and dental exam.  Eye exam Jan 2017, dental exam January 2017.  7d - 138, 14d - 125, 30d - 121   Lifestyle Assessment:  Exercise:  Continues wearing fitbit and tracking steps, walking dog 1-2 times per week.  Patient walks on occasion at work during a break.  She has been dealing with fatigue after work that seems to have increased, making it difficult for her to get motivated to exercise at night.  Does not wish to set any goals at this time.     Diet:  No significant changes.  Patient has continued to make healthy dietary choices.  She attempts to eat at least two servings of fresh fruit or vegetables per day.  She also has made an effort to limit ice cream as this is a weakness of hers.  She does not wish to set any additional goals at this time.   Plan and Goals: 1)  Continue to increase exercise by taking walk breaks as allowed at  work 2)  Continue to make healthy dietary choices  3)  Continue testing as needed 4)  Follow-up in 6 months on Wednesday July 26th @ 11:00 am  Great to see you today!   Tilman Neat, PharmD [facility]  [phone]

## 2015-04-03 NOTE — Patient Instructions (Signed)
1)  Continue to increase exercise by taking walk breaks as allowed at work 2)  Continue to make healthy dietary choices  3)  Continue testing as needed 4)  Follow-up in 6 months on Wednesday July 26th @ 11:00 am  Great to see you today!

## 2015-04-11 NOTE — Progress Notes (Signed)
I have reviewed this pharmacist's note and agree  

## 2015-04-28 MED FILL — FENOFIBRATE 160 MG TABLET: 160 | 90 days supply | Qty: 90 | Fill #0

## 2015-06-02 MED FILL — FLUoxetine HCL 40 MG CAPS: 40 | 90 days supply | Qty: 90 | Fill #0

## 2015-07-07 MED FILL — LISINOPRIL 20 MG TABLET: 20 | 90 days supply | Qty: 90 | Fill #0

## 2015-07-09 DIAGNOSIS — I10 Essential (primary) hypertension: Secondary | ICD-10-CM | POA: Diagnosis not present

## 2015-07-09 DIAGNOSIS — Z0000 Encounter for general adult medical examination without abnormal findings: Secondary | ICD-10-CM | POA: Diagnosis not present

## 2015-07-09 DIAGNOSIS — E042 Nontoxic multinodular goiter: Secondary | ICD-10-CM | POA: Diagnosis not present

## 2015-07-09 DIAGNOSIS — E559 Vitamin D deficiency, unspecified: Secondary | ICD-10-CM | POA: Diagnosis not present

## 2015-07-09 DIAGNOSIS — E782 Mixed hyperlipidemia: Secondary | ICD-10-CM | POA: Diagnosis not present

## 2015-07-09 DIAGNOSIS — F339 Major depressive disorder, recurrent, unspecified: Secondary | ICD-10-CM | POA: Diagnosis not present

## 2015-07-09 DIAGNOSIS — F419 Anxiety disorder, unspecified: Secondary | ICD-10-CM | POA: Diagnosis not present

## 2015-07-09 DIAGNOSIS — R5383 Other fatigue: Secondary | ICD-10-CM | POA: Diagnosis not present

## 2015-07-09 DIAGNOSIS — R7301 Impaired fasting glucose: Secondary | ICD-10-CM | POA: Diagnosis not present

## 2015-07-14 ENCOUNTER — Other Ambulatory Visit: Payer: Self-pay

## 2015-07-14 DIAGNOSIS — Z1231 Encounter for screening mammogram for malignant neoplasm of breast: Secondary | ICD-10-CM

## 2015-07-16 ENCOUNTER — Ambulatory Visit: Payer: 59 | Admitting: Neurology

## 2015-07-21 ENCOUNTER — Ambulatory Visit: Payer: 59

## 2015-07-28 ENCOUNTER — Ambulatory Visit: Payer: 59 | Admitting: Diagnostic Neuroimaging

## 2015-08-11 ENCOUNTER — Ambulatory Visit
Admission: RE | Admit: 2015-08-11 | Discharge: 2015-08-11 | Disposition: A | Discharge status: Home / Self Care | Payer: 59 | Source: Ambulatory Visit

## 2015-08-11 DIAGNOSIS — Z1231 Encounter for screening mammogram for malignant neoplasm of breast: Secondary | ICD-10-CM

## 2015-08-18 NOTE — Progress Notes (Signed)
Patient ID: Lori Walsh, female   DOB: 08-12-1955, 60 y.o.   MRN: [id number]     Londell Moh Date of Birth:  03-Sep-1955 [facility] [address] [place], [place]  [address] [phone]        Fax   [phone]   History of Present Illness: This pleasant 60 y.o.  woman is seen by me for the first time today.  She is referred by Dr Drema Dallas for increasing dyspnea    The patient does not have any history known coronary disease.  In 2008 she had a Myoview stress test at [facility] which showed a questionable anterior wall scar and possible reversible ischemia.  She states that there was a question of breast tissue artifact and she was given the option to pursue the finding with further studies or to just watch it and she elected to just watch it.  She had done well over the ensuing years    She does try to get regular walking exercise.  She takes the stairs at work rather than the [employer].  Had recurrent chest pains in 2016 and myovue was normal 04/16/14  EF 82% no artifacts this time  The patient has a history of hypertension for the past 10 years.  She is a borderline diabetic.  She has had problems with weight gain.  She is a nonsmoker.  She does have a history of high cholesterol and she is intolerant to Crestor, Lipitor, and simvastatin which caused muscle pain.  Her family history reveals that her father underwent coronary artery bypass graft surgery in his 74s.  He died at 52 of complications of parkinsonism. The patient's mother died at age 57 of a ruptured intracerebral aneurysm.  Her dyspnea seems functional A friend noted her breathing heavy walking. No asthma / wheezing non smoker with no chronic lung DX  She has worked at the [employer] for 45 years   Current Outpatient Prescriptions  Medication Sig Dispense Refill  . aspirin EC 81 MG tablet Take 81 mg by mouth at bedtime.     . cholecalciferol (VITAMIN D) 1000 UNITS tablet Take 1,000 Units by mouth daily.    .  fenofibrate 160 MG tablet Take 160 mg by mouth daily.    . fish oil-omega-3 fatty acids 1000 MG capsule Take 1 g by mouth daily.    Marland Kitchen FLUoxetine (PROZAC) 40 MG capsule Take 40 mg by mouth daily.    Marland Kitchen glucosamine-chondroitin 500-400 MG tablet Take 1 tablet by mouth daily.     Marland Kitchen lisinopril (PRINIVIL,ZESTRIL) 20 MG tablet Take 20 mg by mouth at bedtime.     . Red Yeast Rice 600 MG CAPS Take 1 capsule by mouth daily.    Marland Kitchen trolamine salicylate (ASPERCREME) 10 % cream Apply 1 application topically as needed for muscle pain (hip).     No current facility-administered medications for this visit.    Allergies  Allergen Reactions  . Crestor [Rosuvastatin]     MUSCLE ACHES   . Lipitor [Atorvastatin]     MUSCLE ACHES  . Strawberry Extract Itching  . Zocor [Simvastatin]     MUSCLE ACHES     Patient Active Problem List   Diagnosis Date Noted  . Buttock pain 01/03/2015  . Greater trochanteric bursitis of left hip 01/01/2015  . Leg length discrepancy 01/01/2015  . Chest pain of uncertain etiology [id number]  . Family history of ischemic heart disease 04/11/2014  . Pre-diabetes 07/13/2012    History  Smoking status  . Never Smoker   Smokeless tobacco  . Not on file    History  Alcohol Use  . Yes    Comment: occasional    Family History  Problem Relation Age of Onset  . Aneurysm Mother   . Hypertension Mother   . Cancer Maternal Grandmother   . Heart attack Maternal Grandfather   . Esophageal cancer Paternal Grandfather   . HIV Son     Review of Systems: Constitutional: no fever chills diaphoresis or fatigue or change in weight.  Head and neck: no hearing loss, no epistaxis, no photophobia or visual disturbance. Respiratory: No cough, shortness of breath or wheezing. Cardiovascular: Positive for chest pain and dyspnea Gastrointestinal: No abdominal distention, no abdominal pain, no change in bowel habits hematochezia or melena. Genitourinary: No dysuria, no frequency, no  urgency, no nocturia. Musculoskeletal:No arthralgias, no back pain, no gait disturbance or myalgias. Neurological: No dizziness, no headaches, no numbness, no seizures, no syncope, no weakness, no tremors. Hematologic: No lymphadenopathy, no easy bruising. Psychiatric: Positive for depression   Wt Readings from Last 3 Encounters:  08/19/15 83.371 kg (183 lb 12.8 oz)  04/03/15 83.915 kg (185 lb)  01/07/15 81.647 kg (180 lb)    Physical Exam: Filed Vitals:   08/19/15 1019  BP: 134/82  Pulse: 80   The patient appears to be in no distress.  Head and neck exam reveals that the pupils are equal and reactive.  The extraocular movements are full.  There is no scleral icterus.  Mouth and pharynx are benign.  No lymphadenopathy.  No carotid bruits.  The jugular venous pressure is normal.  Thyroid is not enlarged or tender.  Chest is clear to percussion and auscultation.  No rales or rhonchi.  Expansion of the chest is symmetrical.  Heart reveals no abnormal lift or heave.  First and second heart sounds are normal.  There is no murmur gallop rub or click.  The abdomen is soft and nontender.  Bowel sounds are normoactive.  There is no hepatosplenomegaly or mass.  There are no abdominal bruits.  Extremities reveal no phlebitis or edema.  Pedal pulses are good.  There is no cyanosis or clubbing.  Neurologic exam is normal strength and no lateralizing weakness.  No sensory deficits.  Integument reveals no rash  EKG shows normal sinus rhythm and minimal ST segment abnormality.  Since previous tracing of 2008, no significant change  Assessment / Plan:  1. Dyspnea seems functional normal cardiopulmonary exam normal EF myovue 10/2014 f/u echo  2. HTN:  Well controlled.  Continue current medications and low sodium Dash type diet.   3. Cholesterol A low fat, low cholesterol is discussed with the patient, and a written copy is given to her. 4. Obesity:  Discussed low carb diet and exercise.   5.  Family History CAD:  Normal myovue 10/2014 discussed utility of calcium score for risk stratification will try to do  Today    Jenkins Rouge

## 2015-08-19 ENCOUNTER — Inpatient Hospital Stay: Admission: RE | Admit: 2015-08-19 | Payer: 59 | Source: Ambulatory Visit

## 2015-08-19 ENCOUNTER — Ambulatory Visit (INDEPENDENT_AMBULATORY_CARE_PROVIDER_SITE_OTHER): Payer: 59 | Admitting: Cardiovascular Disease

## 2015-08-19 ENCOUNTER — Encounter: Payer: Self-pay | Admitting: Cardiovascular Disease

## 2015-08-19 VITALS — BP 134/82 | HR 80 | Ht 64.0 in | Wt 183.8 lb

## 2015-08-19 DIAGNOSIS — I10 Essential (primary) hypertension: Secondary | ICD-10-CM | POA: Diagnosis not present

## 2015-08-19 DIAGNOSIS — R0600 Dyspnea, unspecified: Secondary | ICD-10-CM | POA: Diagnosis not present

## 2015-08-19 NOTE — Patient Instructions (Addendum)
Medication Instructions:  Your physician recommends that you continue on your current medications as directed. Please refer to the Current Medication list given to you today.  Labwork: NONE  Testing/Procedures: Your physician has requested that you have an echocardiogram. Echocardiography is a painless test that uses sound waves to create images of your heart. It provides your doctor with information about the size and shape of your heart and how well your heart's chambers and valves are working. This procedure takes approximately one hour. There are no restrictions for this procedure.  Cardiac CT scanning for Calcium score (CAT scanning), is a noninvasive, special x-ray that produces cross-sectional images of the body using x-rays and a computer. CT scans help physicians diagnose and treat medical conditions. For some CT exams, a contrast material is used to enhance visibility in the area of the body being studied. CT scans provide greater clarity and reveal more details than regular x-ray exams.  Follow-Up: Your physician wants you to follow-up in: 12 months with Dr. Johnsie Cancel. You will receive a reminder letter in the mail two months in advance. If you don't receive a letter, please call our office to schedule the follow-up appointment.   If you need a refill on your cardiac medications before your next appointment, please call your pharmacy.

## 2015-09-01 MED FILL — FLUoxetine HCL 40 MG CAPS: 40 | 90 days supply | Qty: 90 | Fill #0

## 2015-09-04 ENCOUNTER — Ambulatory Visit (INDEPENDENT_AMBULATORY_CARE_PROVIDER_SITE_OTHER)
Admission: RE | Admit: 2015-09-04 | Discharge: 2015-09-04 | Disposition: A | Discharge status: Home / Self Care | Payer: Self-pay | Source: Ambulatory Visit | Attending: Cardiovascular Disease | Admitting: Cardiovascular Disease

## 2015-09-04 ENCOUNTER — Ambulatory Visit (HOSPITAL_COMMUNITY): Payer: 59 | Attending: Cardiology

## 2015-09-04 ENCOUNTER — Other Ambulatory Visit: Payer: Self-pay

## 2015-09-04 DIAGNOSIS — R0600 Dyspnea, unspecified: Secondary | ICD-10-CM | POA: Insufficient documentation

## 2015-09-04 DIAGNOSIS — I10 Essential (primary) hypertension: Secondary | ICD-10-CM | POA: Diagnosis not present

## 2015-09-04 LAB — ECHOCARDIOGRAM COMPLETE
E decel time: 151 msec
EERAT: 9.71
FS: 34 % (ref 28–44)
IV/PV OW: 1.11
LA ID, A-P, ES: 34 mm
LA diam end sys: 34 mm
LA diam index: 1.8 cm/m2
LA vol A4C: 22 ml
LA vol index: 14.3 mL/m2
LA vol: 27 mL
LV E/e' medial: 9.71
LV E/e'average: 9.71
LV PW d: 8.35 mm — AB (ref 0.6–1.1)
LV TDI E'LATERAL: 10.4
LV e' LATERAL: 10.4 cm/s
LVOT VTI: 21.1 cm
LVOT area: 2.54 cm2
LVOT diameter: 18 mm
LVOT peak grad rest: 4 mmHg
LVOTPV: 102 cm/s
LVOTSV: 54 mL
MV Dec: 151
MV Peak grad: 4 mmHg
MV pk A vel: 99.8 m/s
MV pk E vel: 101 m/s
TDI e' medial: 8.44

## 2015-09-05 ENCOUNTER — Telehealth: Payer: Self-pay | Admitting: Cardiovascular Disease

## 2015-09-05 NOTE — Telephone Encounter (Signed)
Called patient back about her echo and Cardiac Ca Score. Patient is aware of results, and has no other questions at this time.

## 2015-09-05 NOTE — Telephone Encounter (Signed)
New message      The pt is calling cause she states she got a recording to call back and ask for Pam, the pt thinks it is about testing results

## 2015-09-23 ENCOUNTER — Ambulatory Visit: Payer: Self-pay | Admitting: Pharmacist

## 2015-10-01 ENCOUNTER — Ambulatory Visit: Payer: 59 | Admitting: Pharmacist

## 2015-10-03 MED FILL — LISINOPRIL 20 MG TABLET: 20 | 90 days supply | Qty: 90 | Fill #1

## 2015-10-03 MED FILL — FENOFIBRATE 160 MG TABLET: 160 | 90 days supply | Qty: 90 | Fill #1

## 2015-10-28 ENCOUNTER — Other Ambulatory Visit: Payer: Self-pay | Admitting: Pharmacist

## 2015-10-28 ENCOUNTER — Encounter: Payer: Self-pay | Admitting: Pharmacist

## 2015-10-28 NOTE — Patient Outreach (Signed)
[facility]) Care Management  10/28/2015  Lori Walsh 06/20/1955 [id number]   Subjective:  Patient is a 60 yo female with pre-diabetes who presents today for follow-up as part of the employer-sponsored Link to Wellness program. Pt is not currently treated for diabetes.  Patient also continues on daily ASA and ACEi.  She is not tolerant of statins but is on red yeast rice daily along with fenofibrate.  Most recent MD follow-up was April 2017. Patient has a pending appt for 6 mo follow-up in Oct 2017.  No med changes or major health changes at this time.  Of note, patient recently had a cardiology work-up for shortness of breath and fatigue.  All results were normal and patient will return for follow-up in 1 year.      Diabetes Assessment:  No changes to pre-diabetes treatment at this time. Patient does maintain good medication compliance. Most recent A1c was done today via POC testing result of 6.2% (prev 5.8%) which is worsened and above goal of less than 6%.  Weight has decreased by an additional 2 lbs since last visit.  Patient did bring meter today and is currently testing 2-3 times per week, fasting and post prandial.  Glucose readings are 89-130s per glucometer.  Patient denies signs and symptoms of neuropathy including numbness/tingling/burning and symptoms of foot infection.  Patient is up to date on eye and dental exam.  Eye exam Jan 2017, dental exam January 2017.  7d - 104, 14d - 102, 30d - 112   Lifestyle Assessment:  Exercise:  Patient now has a [employer] and enjoys swimming 1-2 times per week.  She is no longer wearing fitbit as it is in need of repair, but she does continue walking daily on her lunch break.  She will continue to walk and attempt to increase exercise as needed.    Diet:  No significant changes.  Patient has continued to make healthy dietary choices.  She attempts to eat at least two servings of fresh fruit or vegetables per day.   She also has made an effort to limit snacks and make better snack choices.  She feels that portions are under control.    24 hr food recall: Breakfast - yogurt with granola, protein granola, scrambled eggs on occasion, raisin bran Lunch - variable - packing from home, or soup or vegetables from [employer] - 1/2 and 1/2 home and out, crock pot recipes  Beverages - flavored water, no longer buying soda   Plan and Goals: 1)  Continue to increase exercise by taking walk breaks as allowed at work and continue swimming at least once weekly 2)  Continue to make healthy dietary choices, specifically focusing on limiting snacks 3)  Continue testing as needed 4)  Follow-up in 6 months on Thursday February 22nd @ 11:30 am  Great to see you today!

## 2015-11-26 MED FILL — FLUoxetine HCL 40 MG CAPS: 40 | 90 days supply | Qty: 90 | Fill #1

## 2015-12-02 DIAGNOSIS — H2513 Age-related nuclear cataract, bilateral: Secondary | ICD-10-CM | POA: Diagnosis not present

## 2015-12-02 DIAGNOSIS — H40013 Open angle with borderline findings, low risk, bilateral: Secondary | ICD-10-CM | POA: Diagnosis not present

## 2016-01-13 MED FILL — LISINOPRIL 20 MG TABLET: 20 | 90 days supply | Qty: 90 | Fill #0

## 2016-01-13 MED FILL — FENOFIBRATE 160 MG TABLET: 160 | 90 days supply | Qty: 90 | Fill #0

## 2016-03-05 MED FILL — FLUoxetine HCL 40 MG CAPS: 40 | 90 days supply | Qty: 90 | Fill #2

## 2016-04-08 MED FILL — LISINOPRIL 20 MG TABLET: 20 | 90 days supply | Qty: 90 | Fill #1

## 2016-04-29 ENCOUNTER — Ambulatory Visit: Payer: 59 | Admitting: Pharmacist

## 2016-05-10 MED FILL — FENOFIBRATE 160 MG TABLET: 160 | 90 days supply | Qty: 90 | Fill #1

## 2016-05-31 DIAGNOSIS — H2513 Age-related nuclear cataract, bilateral: Secondary | ICD-10-CM | POA: Diagnosis not present

## 2016-05-31 DIAGNOSIS — H40013 Open angle with borderline findings, low risk, bilateral: Secondary | ICD-10-CM | POA: Diagnosis not present

## 2016-07-07 ENCOUNTER — Telehealth: Payer: Self-pay

## 2016-07-07 NOTE — Patient Outreach (Signed)
Called Link to Wellness member to schedule a follow up appointment, there was no answer, left message to call me back. 

## 2016-07-09 MED FILL — LISINOPRIL 20 MG TABLET: 20 | 90 days supply | Qty: 90 | Fill #0

## 2016-07-13 MED FILL — FLUoxetine HCL 40 MG CAPS: 40 | 90 days supply | Qty: 90 | Fill #3

## 2016-07-15 ENCOUNTER — Other Ambulatory Visit: Payer: Self-pay | Admitting: Family Medicine

## 2016-07-15 ENCOUNTER — Other Ambulatory Visit (HOSPITAL_COMMUNITY)
Admission: RE | Admit: 2016-07-15 | Discharge: 2016-07-15 | Disposition: A | Discharge status: Home / Self Care | Payer: 59 | Source: Ambulatory Visit | Attending: Family Medicine | Admitting: Family Medicine

## 2016-07-15 DIAGNOSIS — R7301 Impaired fasting glucose: Secondary | ICD-10-CM | POA: Diagnosis not present

## 2016-07-15 DIAGNOSIS — I10 Essential (primary) hypertension: Secondary | ICD-10-CM | POA: Diagnosis not present

## 2016-07-15 DIAGNOSIS — F339 Major depressive disorder, recurrent, unspecified: Secondary | ICD-10-CM | POA: Diagnosis not present

## 2016-07-15 DIAGNOSIS — F419 Anxiety disorder, unspecified: Secondary | ICD-10-CM | POA: Diagnosis not present

## 2016-07-15 DIAGNOSIS — Z0000 Encounter for general adult medical examination without abnormal findings: Secondary | ICD-10-CM | POA: Diagnosis not present

## 2016-07-15 DIAGNOSIS — E559 Vitamin D deficiency, unspecified: Secondary | ICD-10-CM | POA: Diagnosis not present

## 2016-07-15 DIAGNOSIS — Z124 Encounter for screening for malignant neoplasm of cervix: Secondary | ICD-10-CM | POA: Diagnosis not present

## 2016-07-15 DIAGNOSIS — E782 Mixed hyperlipidemia: Secondary | ICD-10-CM | POA: Diagnosis not present

## 2016-07-15 DIAGNOSIS — Z23 Encounter for immunization: Secondary | ICD-10-CM | POA: Diagnosis not present

## 2016-07-15 DIAGNOSIS — E042 Nontoxic multinodular goiter: Secondary | ICD-10-CM | POA: Diagnosis not present

## 2016-07-16 LAB — CYTOLOGY - PAP: Diagnosis: NEGATIVE

## 2016-08-16 ENCOUNTER — Telehealth: Payer: Self-pay

## 2016-10-06 ENCOUNTER — Ambulatory Visit: Payer: Self-pay | Admitting: Pharmacist

## 2016-10-08 NOTE — Patient Outreach (Signed)
[facility]) Care Management  10/08/2016  Lori Walsh 1955-03-28 [id number]  61 y.o. year old female referred to [place] for Diabetes [facility] Telephone Outreach)   Was unable to reach patient via telephone today and have left HIPAA compliant voicemail asking patient to return my call (unsuccessful outreach #2).  Plan: Will followup in 1 business day via telephone

## 2016-10-12 MED FILL — LISINOPRIL 20 MG TAB: 20 | 90 days supply | Qty: 90 | Fill #0

## 2016-10-12 NOTE — Patient Outreach (Signed)
60 y.o. year old female referred to [place] for Diabetes [employer] [facility] [facility] Telephone Outreach)  Called and successfully made appointment for 10/15/2016 with me.   Carlean Jews, Pharm.D. PGY2 Ambulatory Care Pharmacy Resident Phone: [phone]

## 2016-10-15 ENCOUNTER — Encounter: Payer: Self-pay | Admitting: Pharmacist

## 2016-10-15 ENCOUNTER — Other Ambulatory Visit: Payer: Self-pay | Admitting: Pharmacist

## 2016-10-15 VITALS — BP 143/79 | HR 74 | Wt 181.2 lb

## 2016-10-15 DIAGNOSIS — R7303 Prediabetes: Secondary | ICD-10-CM

## 2016-10-15 NOTE — Patient Outreach (Signed)
[place] [facility]) Care Management  10/15/2016  Lori Walsh 03-08-1956 [id number]  Subjective: Patient presents today for 3 month diabetes follow-up as part of the employer-sponsored Link to Wellness program  Current pre-diabetes controlled without medication. Patient continues on daily aspirin (reports intolerance to multiple statins). Patient endorses compliance to medications. Most recent MD follow-up was May 2018. Patient has a pending appt for follow up next year. No med changes or major health changes at this time.   Patient reported dietary habits: Eats 3 meals/day, skips lunch when has late breakfast; 24 hr diet recall below: Breakfast: oatmeal, pecans, blueberries, brown sugar Lunch: leftovers from dinner Dinner: chicken wrap from [employer], honey mustard dressing Snacks: peanuts, gum, hard candy Drinks: water, half and half tea; has cut back on diet Dr. Malachi Bonds   Patient reported exercise habits: 30-40 minutes walking 3x a week; parks 10  mins away from work as a form of exercise  Patient denies hypoglycemic events. Endorses lightheadedness when hypoglycemic. Patient reports nocturia 1x a night, stable. Patient denies pain/burning upon urination.  Patient denies neuropathy. Patient denies visual changes. Endorses family history of glaucoma Patient reports self foot exams. No issues.   Patient reported self monitored blood glucose less than 1x a week Home fasting CBG: 100-120, excursions to 89  Objective:  Lab Results  Component Value Date   HGBA1C 5.9 08/28/2014  Last A1C 5.8 (07/2016) - per patient report Filed Weights   10/15/16 1211  Weight: 181 lb 3.2 oz (82.2 kg)   Vitals:   10/15/16 1211  BP: (!) 143/79  Pulse: 74    Lipid Panel  No results found for: CHOL, TRIG, HDL, CHOLHDL, VLDL, LDLCALC, LDLDIRECT  10 year ASCVD risk: Unable to calculate (no lipid panel available)  Last eye exam: 07/2016 Last dental exam:  2017  Assessment: Pre-Diabetes: controlled, limited by dietary indiscretion (snacks), sedentary lifestyle - A1C 5.8% which is at goal of less than 6% as pt is pre-diabetic.  - Weight is increased from last visit with Link to Wellness.   Physical Activity-  limited by lack of motivation (perceived lack of time and stress from home life) - Below goal of at least 150 minutes of moderate intensity exercise weekly  Nutrition- Endorses frequent snacking, limited by dietary indiscretion due to unhealthy food for work celebrations - Maintaining adherence to diabetic diet 70% of the time  Plan/Goals for Next Visit: - Counseled on s/sx hypoglycemia and appropriate treatment - Counseled on proper techniques for strength training (rowing), and importance of exercise - Diet goal: limit snacking by planning out snacks throughout the day - Exercise goal: increase exercise to 30 mins 5x a week by walking with family in the evening - Obtain test strips and increase BG testing to 2x a week. - Follow up dental exam - Detailed goals and action plans are listed below  Next Link to Wellness appointment is 01/24/2017 with Ulanda Edison (PGY1 Pharmacy Resident)  [name], PharmD [facility] PGY2 Pharmacy Resident [phone] [facility] CM Care Plan Problem One     Most Recent Value  Care Plan Problem One  Sedentary lifestyle  Role Documenting the Problem One  Clinical Pharmacist  Care Plan for Problem One  Active  [facility] Long Term Goal   Within the next 90 days patient will increase walking to 30 minutes 5x/week by walking with family in the evenings as evidenced by patient report  [facility] Long Term Goal Start Date  10/15/16  Interventions for Problem One Long Term Goal  Counseled on recommended 150 mins/week of moderate intensity exercise, counseled on proper technique for strenght training, encouraged her to meet this goal    [facility] CM Care Plan Problem Two     Most Recent Value  Care Plan Problem Two  Dietary  indiscretion  Role Documenting the Problem Two  Clinical Pharmacist  Care Plan for Problem Two  Active  Interventions for Problem Two Long Term Goal   Counseled on importance of establishing a schedule to improve self control  [facility] Long Term Goal  Within the next 90 days patient will decrease snacking by planning scheduled, healthy snacks throughout the day, as evidenced by patient report  [facility] Long Term Goal Start Date  10/15/16    [facility] CM Care Plan Problem Three     Most Recent Value  Care Plan Problem Three  Infrequent CBG monitoring  Role Documenting the Problem Three  Clinical Pharmacist  Care Plan for Problem Three  Active  [facility] Long Term Goal   Within the next 90 days patient will obtain additional test strips and increase CBG monitoring to 2x/week as evidenced by CBG log  [facility] Long Term Goal Start Date  10/15/16  Interventions for Problem Three Long Term Goal  Provided with information on current covered meters and supplies, provided with calendar/CBG log

## 2016-10-18 DIAGNOSIS — H40013 Open angle with borderline findings, low risk, bilateral: Secondary | ICD-10-CM | POA: Diagnosis not present

## 2016-10-18 DIAGNOSIS — H2513 Age-related nuclear cataract, bilateral: Secondary | ICD-10-CM | POA: Diagnosis not present

## 2016-11-01 MED FILL — FENOFIBRATE 160 MG TABLET: 160 | 90 days supply | Qty: 90 | Fill #0

## 2016-11-12 MED FILL — FLUoxetine HCL 40 MG CAPS: 40 | 90 days supply | Qty: 90 | Fill #0

## 2017-01-10 MED FILL — LISINOPRIL 20 MG TABS: 20 | 90 days supply | Qty: 90 | Fill #1

## 2017-01-24 ENCOUNTER — Ambulatory Visit: Payer: 59

## 2017-01-25 ENCOUNTER — Ambulatory Visit: Payer: Self-pay | Admitting: Emergency Medicine

## 2017-01-25 VITALS — BP 142/88 | HR 91 | Temp 98.7°F | Resp 20 | Wt 176.2 lb

## 2017-01-25 DIAGNOSIS — J0100 Acute maxillary sinusitis, unspecified: Secondary | ICD-10-CM

## 2017-01-25 MED ORDER — AMOXICILLIN 875 MG PO TABS: 875.0000 mg | ORAL_TABLET | Freq: Two times a day (BID) | ORAL | 0 refills | Status: DC

## 2017-01-25 MED ORDER — FLUTICASONE PROPIONATE 50 MCG/ACT NA SUSP: 2.0000 | Freq: Two times a day (BID) | NASAL | 0 refills | Status: AC

## 2017-01-25 MED ORDER — LORATADINE-PSEUDOEPHEDRINE ER 10-240 MG PO TB24: 1.0000 | ORAL_TABLET | Freq: Every day | ORAL | 0 refills | Status: DC

## 2017-01-25 MED ORDER — FLUCONAZOLE 200 MG PO TABS: ORAL_TABLET | ORAL | 1 refills | Status: DC

## 2017-01-25 MED FILL — FLUCONAZOLE 200 MG TABLET: 200 | 4 days supply | Qty: 2 | Fill #0

## 2017-01-25 MED FILL — AMOXICILLIN 875 MG TABLET: 875 | 10 days supply | Qty: 20 | Fill #0

## 2017-01-25 MED FILL — FLUTICASONE PROP 50 MCG SPR: 50 | 30 days supply | Qty: 16 | Fill #0

## 2017-01-25 NOTE — Progress Notes (Signed)
Lori Walsh is a 61 y.o. female who presents for evaluation of Sinus pain, congestion, and otalgia.  Symptoms include congestion, facial pain, nasal congestion and swollen glands.  Onset of symptoms was 7 days ago, and has been unchanged since that time.  Treatment to date:  acetaminophen, decongestants and rest.  High risk factors for influenza complications:  none.  The following portions of the patient's history were reviewed and updated as appropriate:  allergies, current medications and past medical history.  Review of Systems  Constitutional: Negative for chills and fever.  HENT: Positive for congestion, ear pain and sinus pain. Negative for ear discharge, hearing loss and sore throat.   Eyes: Negative.   Respiratory: Negative.   Cardiovascular: Negative.   Gastrointestinal: Negative for diarrhea, nausea and vomiting.  Musculoskeletal: Negative.   Skin: Negative.   Neurological: Negative.     Objective:   Vitals:   01/25/17 1410  BP: (!) 142/88  Pulse: 91  Resp: 20  Temp: 98.7 F (37.1 C)  SpO2: 98%    Physical Exam  Constitutional: She is well-developed, well-nourished, and in no distress. No distress.  HENT:  Head: Normocephalic.  Right Ear: External ear normal. Tympanic membrane is bulging. A middle ear effusion is present.  Left Ear: External ear normal. A middle ear effusion is present.  Nose: Right sinus exhibits maxillary sinus tenderness. Left sinus exhibits maxillary sinus tenderness.  Mouth/Throat: Oropharynx is clear and moist. No oropharyngeal exudate.  Eyes: Conjunctivae are normal. Pupils are equal, round, and reactive to light.  Neck: Normal range of motion.  Cardiovascular: Normal rate and normal heart sounds.  Pulmonary/Chest: Effort normal and breath sounds normal. No respiratory distress. She has no wheezes.  Abdominal: Soft. Bowel sounds are normal.  Lymphadenopathy:    She has no cervical adenopathy.  Neurological: She is alert.  Skin: Skin is  warm and dry. She is not diaphoretic.  Nursing note and vitals reviewed.    Assessment:   1. Acute maxillary sinusitis, recurrence not specified       Plan:   1. Acute maxillary sinusitis, recurrence not specified  - amoxicillin (AMOXIL) 875 MG tablet; Take 1 tablet (875 mg total) by mouth 2 (two) times daily.  Dispense: 20 tablet; Refill: 0 - fluticasone (FLONASE) 50 MCG/ACT nasal spray; Place 2 sprays into both nostrils 2 (two) times daily.  Dispense: 15.8 g; Refill: 0 - loratadine-pseudoephedrine (CLARITIN-D 24-HOUR) 10-240 MG 24 hr tablet; Take 1 tablet by mouth daily.  Dispense: 30 tablet; Refill: 0   Pt also requested a diflucan for possible yeast infection after antibiotics. Will send Rx to her pharmacy

## 2017-01-25 NOTE — Patient Instructions (Signed)

## 2017-01-28 ENCOUNTER — Telehealth: Payer: Self-pay | Admitting: Emergency Medicine

## 2017-02-10 MED FILL — FLUoxetine HCL 40 MG CAPS: 40 | 90 days supply | Qty: 90 | Fill #1

## 2017-02-15 DIAGNOSIS — H9202 Otalgia, left ear: Secondary | ICD-10-CM | POA: Diagnosis not present

## 2017-02-22 DIAGNOSIS — M26629 Arthralgia of temporomandibular joint, unspecified side: Secondary | ICD-10-CM | POA: Diagnosis not present

## 2017-02-22 DIAGNOSIS — H9202 Otalgia, left ear: Secondary | ICD-10-CM | POA: Diagnosis not present

## 2017-03-21 MED FILL — FENOFIBRATE 160 MG TABLET: 160 | 90 days supply | Qty: 90 | Fill #1

## 2017-04-11 MED FILL — LISINOPRIL 20 MG TABLET: 20 | 90 days supply | Qty: 90 | Fill #2

## 2017-04-29 ENCOUNTER — Telehealth: Payer: 59 | Admitting: Family

## 2017-04-29 DIAGNOSIS — J028 Acute pharyngitis due to other specified organisms: Secondary | ICD-10-CM | POA: Diagnosis not present

## 2017-04-29 DIAGNOSIS — B9689 Other specified bacterial agents as the cause of diseases classified elsewhere: Secondary | ICD-10-CM | POA: Diagnosis not present

## 2017-04-29 MED ORDER — BENZONATATE 100 MG PO CAPS: 100.0000 mg | ORAL_CAPSULE | Freq: Three times a day (TID) | ORAL | 0 refills | Status: DC | PRN

## 2017-04-29 MED ORDER — PREDNISONE 5 MG PO TABS: 5.0000 mg | ORAL_TABLET | ORAL | 0 refills | Status: DC

## 2017-04-29 MED ORDER — AZITHROMYCIN 250 MG PO TABS: ORAL_TABLET | ORAL | 0 refills | Status: DC

## 2017-04-29 MED FILL — AZITHROMYCIN 250 MG TABLET: 250 | 5 days supply | Qty: 6 | Fill #0

## 2017-04-29 MED FILL — BENZONATATE 100 MG CAPS: 100 | 5 days supply | Qty: 30 | Fill #0

## 2017-04-29 MED FILL — predniSONE 5 MG TABS: 5 | 6 days supply | Qty: 21 | Fill #0

## 2017-04-29 NOTE — Progress Notes (Signed)

## 2017-05-01 ENCOUNTER — Telehealth: Payer: 59 | Admitting: Physician Assistant

## 2017-05-01 DIAGNOSIS — S76311A Strain of muscle, fascia and tendon of the posterior muscle group at thigh level, right thigh, initial encounter: Secondary | ICD-10-CM | POA: Diagnosis not present

## 2017-05-01 DIAGNOSIS — S3992XA Unspecified injury of lower back, initial encounter: Secondary | ICD-10-CM

## 2017-05-01 DIAGNOSIS — M5441 Lumbago with sciatica, right side: Secondary | ICD-10-CM

## 2017-05-01 DIAGNOSIS — W19XXXA Unspecified fall, initial encounter: Secondary | ICD-10-CM | POA: Diagnosis not present

## 2017-05-01 NOTE — Progress Notes (Signed)
Based on what you shared with me it looks like you have a condition that should be evaluated in a face to face office visit. Giving mention of injury prior to symptom onset, I recommend you be seen in person today for a focused but detailed examination so the most appropriate treatment can be given.  NOTE: If you entered your credit card information for this eVisit, you will not be charged. You may see a "hold" on your card for the $30 but that hold will drop off and you will not have a charge processed.  If you are having a true medical emergency please call 911.  If you need an urgent face to face visit, [place] has four urgent care centers for your convenience.  If you need care fast and have a high deductible or no insurance consider:   [email or web address] to reserve your spot online an avoid wait times  [facility] [address] [place], [place] [address] 8 am to 8 pm Monday-Friday 10 am to 4 pm Saturday-Sunday *Across the street from [employer]  [place], [address] 8 am to 5 pm Monday-Friday * In the [facility] on the [facility]   The following sites will take your  insurance:  . [facility] [place] a Provider at this Location  [address] [place], [place] [address] . 10 am to 8 pm Monday-Friday . 12 pm to 8 pm Saturday-Sunday   . [facility] at [place] a Provider at this Location  [address] [place], [place], [place] [address] . 8 am to 8 pm Monday-Friday . 9 am to 6 pm Saturday . 11 am to 6 pm Sunday   . [facility] at [place] Get Driving Directions  [address] [place], [place] [address] . 8 am to 8 pm Monday-Friday . 8 am to 4 pm Saturday-Sunday   Your e-visit answers were reviewed by a  board certified advanced clinical practitioner to complete your personal care plan.  Thank you for using e-Visits.

## 2017-05-30 MED FILL — ACCU-CHEK GUIDE TEST STRIP: 90 days supply | Qty: 100 | Fill #0

## 2017-05-30 MED FILL — ACCU-CHEK FASTCLIX LANCETS: 90 days supply | Qty: 102 | Fill #0

## 2017-06-17 MED FILL — FLUoxetine HCL 40 MG CAPS: 40 | 90 days supply | Qty: 90 | Fill #0

## 2017-07-14 MED FILL — LISINOPRIL 20 MG TABLET: 20 | 90 days supply | Qty: 90 | Fill #0

## 2017-07-18 DIAGNOSIS — H40013 Open angle with borderline findings, low risk, bilateral: Secondary | ICD-10-CM | POA: Diagnosis not present

## 2017-07-18 DIAGNOSIS — H2513 Age-related nuclear cataract, bilateral: Secondary | ICD-10-CM | POA: Diagnosis not present

## 2017-07-25 MED FILL — AMOXICILLIN 500 MG CAPSULE: 500 | 10 days supply | Qty: 30 | Fill #0

## 2017-07-27 MED FILL — FENOFIBRATE 160 MG TABLET: 160 | 90 days supply | Qty: 90 | Fill #0

## 2017-08-04 DIAGNOSIS — Z23 Encounter for immunization: Secondary | ICD-10-CM | POA: Diagnosis not present

## 2017-08-04 DIAGNOSIS — I10 Essential (primary) hypertension: Secondary | ICD-10-CM | POA: Diagnosis not present

## 2017-08-04 DIAGNOSIS — F339 Major depressive disorder, recurrent, unspecified: Secondary | ICD-10-CM | POA: Diagnosis not present

## 2017-08-04 DIAGNOSIS — F419 Anxiety disorder, unspecified: Secondary | ICD-10-CM | POA: Diagnosis not present

## 2017-08-04 DIAGNOSIS — J302 Other seasonal allergic rhinitis: Secondary | ICD-10-CM | POA: Diagnosis not present

## 2017-08-04 DIAGNOSIS — E559 Vitamin D deficiency, unspecified: Secondary | ICD-10-CM | POA: Diagnosis not present

## 2017-08-04 DIAGNOSIS — E782 Mixed hyperlipidemia: Secondary | ICD-10-CM | POA: Diagnosis not present

## 2017-08-04 DIAGNOSIS — E042 Nontoxic multinodular goiter: Secondary | ICD-10-CM | POA: Diagnosis not present

## 2017-08-04 DIAGNOSIS — R7301 Impaired fasting glucose: Secondary | ICD-10-CM | POA: Diagnosis not present

## 2017-08-09 ENCOUNTER — Other Ambulatory Visit: Payer: Self-pay | Admitting: Family Medicine

## 2017-08-09 DIAGNOSIS — E042 Nontoxic multinodular goiter: Secondary | ICD-10-CM

## 2017-08-12 ENCOUNTER — Other Ambulatory Visit: Payer: 59

## 2017-08-19 ENCOUNTER — Ambulatory Visit
Admission: RE | Admit: 2017-08-19 | Discharge: 2017-08-19 | Disposition: A | Discharge status: Home / Self Care | Payer: 59 | Source: Ambulatory Visit | Attending: Family Medicine | Admitting: Family Medicine

## 2017-08-19 DIAGNOSIS — E042 Nontoxic multinodular goiter: Secondary | ICD-10-CM | POA: Diagnosis not present

## 2017-09-21 DIAGNOSIS — H40013 Open angle with borderline findings, low risk, bilateral: Secondary | ICD-10-CM | POA: Diagnosis not present

## 2017-09-21 DIAGNOSIS — H2513 Age-related nuclear cataract, bilateral: Secondary | ICD-10-CM | POA: Diagnosis not present

## 2017-10-11 MED FILL — LISINOPRIL 20 MG TABLET: 20 | 90 days supply | Qty: 90 | Fill #1

## 2017-10-12 MED FILL — FLUoxetine HCL 40 MG CAPS: 40 | 90 days supply | Qty: 90 | Fill #0

## 2017-10-20 NOTE — Telephone Encounter (Signed)
error 

## 2017-10-28 ENCOUNTER — Other Ambulatory Visit: Payer: Self-pay | Admitting: Family Medicine

## 2017-10-28 DIAGNOSIS — Z1231 Encounter for screening mammogram for malignant neoplasm of breast: Secondary | ICD-10-CM

## 2017-11-25 ENCOUNTER — Ambulatory Visit
Admission: RE | Admit: 2017-11-25 | Discharge: 2017-11-25 | Disposition: A | Discharge status: Home / Self Care | Payer: 59 | Source: Ambulatory Visit | Attending: Family Medicine | Admitting: Family Medicine

## 2017-11-25 DIAGNOSIS — Z1231 Encounter for screening mammogram for malignant neoplasm of breast: Secondary | ICD-10-CM

## 2017-11-28 ENCOUNTER — Other Ambulatory Visit: Payer: Self-pay | Admitting: Family Medicine

## 2017-11-28 DIAGNOSIS — R928 Other abnormal and inconclusive findings on diagnostic imaging of breast: Secondary | ICD-10-CM

## 2017-11-28 DIAGNOSIS — Z1283 Encounter for screening for malignant neoplasm of skin: Secondary | ICD-10-CM | POA: Diagnosis not present

## 2017-11-28 DIAGNOSIS — D225 Melanocytic nevi of trunk: Secondary | ICD-10-CM | POA: Diagnosis not present

## 2017-11-30 ENCOUNTER — Ambulatory Visit
Admission: RE | Admit: 2017-11-30 | Discharge: 2017-11-30 | Disposition: A | Discharge status: Home / Self Care | Payer: 59 | Source: Ambulatory Visit | Attending: Family Medicine | Admitting: Family Medicine

## 2017-11-30 DIAGNOSIS — R922 Inconclusive mammogram: Secondary | ICD-10-CM | POA: Diagnosis not present

## 2017-11-30 DIAGNOSIS — N6310 Unspecified lump in the right breast, unspecified quadrant: Secondary | ICD-10-CM | POA: Diagnosis not present

## 2017-11-30 DIAGNOSIS — R928 Other abnormal and inconclusive findings on diagnostic imaging of breast: Secondary | ICD-10-CM

## 2017-12-20 MED FILL — FENOFIBRATE 160 MG TABLET: 160 | 90 days supply | Qty: 90 | Fill #0

## 2017-12-20 MED FILL — ACCU-CHEK FASTCLIX LANCETS: 90 days supply | Qty: 102 | Fill #1

## 2017-12-20 MED FILL — ACCU-CHEK GUIDE TEST STRIP: 90 days supply | Qty: 100 | Fill #1

## 2018-01-03 DIAGNOSIS — H40013 Open angle with borderline findings, low risk, bilateral: Secondary | ICD-10-CM | POA: Diagnosis not present

## 2018-01-03 DIAGNOSIS — H2513 Age-related nuclear cataract, bilateral: Secondary | ICD-10-CM | POA: Diagnosis not present

## 2018-01-18 MED FILL — FLUoxetine HCL 40 MG CAPS: 40 | 90 days supply | Qty: 90 | Fill #1

## 2018-01-18 MED FILL — LISINOPRIL 20 MG TABLET: 20 | 90 days supply | Qty: 90 | Fill #2

## 2018-04-03 MED FILL — FLUoxetine HCL 40 MG CAPS: 40 | 90 days supply | Qty: 90 | Fill #2

## 2018-04-03 MED FILL — FENOFIBRATE 160 MG TABLET: 160 | 90 days supply | Qty: 90 | Fill #1

## 2018-04-06 MED FILL — LISINOPRIL 20 MG TABLET: 20 | 90 days supply | Qty: 90 | Fill #0

## 2018-06-10 MED FILL — FENOFIBRATE 160 MG TABLET: 160 | 90 days supply | Qty: 90 | Fill #0

## 2018-06-10 MED FILL — LISINOPRIL 20 MG TABLET: 20 | 90 days supply | Qty: 90 | Fill #0

## 2018-06-10 MED FILL — FLUoxetine HCL 40 MG CAPS: 40 | 90 days supply | Qty: 90 | Fill #0

## 2018-07-12 MED FILL — CEPHALEXIN 500 MG CAPSULE: 500 | 7 days supply | Qty: 14 | Fill #0

## 2018-08-16 MED FILL — LISINOPRIL 20 MG TABLET: 20 | 90 days supply | Qty: 90 | Fill #0

## 2018-10-06 ENCOUNTER — Other Ambulatory Visit (HOSPITAL_COMMUNITY)
Admission: RE | Admit: 2018-10-06 | Discharge: 2018-10-06 | Disposition: A | Discharge status: Home / Self Care | Payer: No Typology Code available for payment source | Source: Ambulatory Visit | Attending: Orthopedic Surgery | Admitting: Orthopedic Surgery

## 2018-10-06 DIAGNOSIS — Z20828 Contact with and (suspected) exposure to other viral communicable diseases: Secondary | ICD-10-CM | POA: Diagnosis not present

## 2018-10-06 LAB — SARS CORONAVIRUS 2 (TAT 6-24 HRS): SARS Coronavirus 2: NEGATIVE
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
## 2018-10-09 ENCOUNTER — Encounter (HOSPITAL_BASED_OUTPATIENT_CLINIC_OR_DEPARTMENT_OTHER): Payer: Self-pay | Admitting: *Deleted

## 2018-10-09 ENCOUNTER — Other Ambulatory Visit: Payer: Self-pay

## 2018-10-09 NOTE — Progress Notes (Signed)
Spoke w/ pt via phone for pre-op interview.  Npo after mn.  Arrive at [facility].  Needs istat and ekg.  Pt had covid test done 10-06-2018.  Will take prozac and fenofibrate am dos w/ sips of water.

## 2018-10-10 ENCOUNTER — Ambulatory Visit (HOSPITAL_BASED_OUTPATIENT_CLINIC_OR_DEPARTMENT_OTHER)
Admission: RE | Admit: 2018-10-10 | Discharge: 2018-10-10 | Disposition: A | Discharge status: Home / Self Care | Payer: No Typology Code available for payment source | Attending: Orthopedic Surgery | Admitting: Orthopedic Surgery

## 2018-10-10 ENCOUNTER — Other Ambulatory Visit: Payer: Self-pay

## 2018-10-10 ENCOUNTER — Encounter (HOSPITAL_BASED_OUTPATIENT_CLINIC_OR_DEPARTMENT_OTHER)
Admission: RE | Disposition: A | Discharge status: Home / Self Care | Payer: Self-pay | Source: Home / Self Care | Attending: Orthopedic Surgery

## 2018-10-10 ENCOUNTER — Ambulatory Visit (HOSPITAL_BASED_OUTPATIENT_CLINIC_OR_DEPARTMENT_OTHER): Payer: No Typology Code available for payment source | Admitting: Anesthesiology

## 2018-10-10 ENCOUNTER — Encounter (HOSPITAL_BASED_OUTPATIENT_CLINIC_OR_DEPARTMENT_OTHER): Payer: Self-pay

## 2018-10-10 DIAGNOSIS — I10 Essential (primary) hypertension: Secondary | ICD-10-CM | POA: Diagnosis not present

## 2018-10-10 DIAGNOSIS — S52572A Other intraarticular fracture of lower end of left radius, initial encounter for closed fracture: Secondary | ICD-10-CM | POA: Diagnosis present

## 2018-10-10 DIAGNOSIS — S62102A Fracture of unspecified carpal bone, left wrist, initial encounter for closed fracture: Secondary | ICD-10-CM

## 2018-10-10 HISTORY — DX: Prediabetes: R73.03

## 2018-10-10 HISTORY — DX: Personal history of gestational diabetes: Z86.32

## 2018-10-10 HISTORY — PX: ORIF WRIST FRACTURE: SHX2133

## 2018-10-10 HISTORY — DX: Depression, unspecified: F32.A

## 2018-10-10 HISTORY — DX: Fracture of unspecified carpal bone, left wrist, initial encounter for closed fracture: S62.102A

## 2018-10-10 HISTORY — DX: Arthralgia of temporomandibular joint, unspecified side: M26.629

## 2018-10-10 HISTORY — DX: Personal history of other medical treatment: Z92.89

## 2018-10-10 HISTORY — DX: Gastro-esophageal reflux disease without esophagitis: K21.9

## 2018-10-10 LAB — POCT I-STAT 4, (NA,K, GLUC, HGB,HCT)
Glucose, Bld: 122 mg/dL — ABNORMAL HIGH (ref 70–99)
HCT: 38 % (ref 36.0–46.0)
Hemoglobin: 12.9 g/dL (ref 12.0–15.0)
Potassium: 3.8 mmol/L (ref 3.5–5.1)
Sodium: 139 mmol/L (ref 135–145)

## 2018-10-10 SURGERY — OPEN REDUCTION INTERNAL FIXATION (ORIF) WRIST FRACTURE
Anesthesia: General | Site: Wrist | Laterality: Left

## 2018-10-10 MED ORDER — CHLORHEXIDINE GLUCONATE 4 % EX LIQD
60.0000 mL | Freq: Once | CUTANEOUS | Status: DC
  Filled 2018-10-10: qty 118

## 2018-10-10 MED ORDER — MIDAZOLAM HCL 2 MG/2ML IJ SOLN
INTRAMUSCULAR | Status: AC
  Filled 2018-10-10: qty 2

## 2018-10-10 MED ORDER — LACTATED RINGERS IV SOLN
INTRAVENOUS | Status: DC
  Filled 2018-10-10: qty 1000

## 2018-10-10 MED ORDER — PROPOFOL 10 MG/ML IV BOLUS
INTRAVENOUS | Status: DC | PRN
  Administered 2018-10-10: 20 mg via INTRAVENOUS
  Administered 2018-10-10: 30 mg via INTRAVENOUS
  Administered 2018-10-10: 20 mg via INTRAVENOUS
  Administered 2018-10-10: 30 mg via INTRAVENOUS
  Administered 2018-10-10: 20 mg via INTRAVENOUS

## 2018-10-10 MED ORDER — MIDAZOLAM HCL 2 MG/2ML IJ SOLN
2.0000 mg | Freq: Once | INTRAMUSCULAR | Status: AC
  Administered 2018-10-10: 2 mg via INTRAVENOUS
  Filled 2018-10-10: qty 2

## 2018-10-10 MED ORDER — FENTANYL CITRATE (PF) 100 MCG/2ML IJ SOLN
50.0000 ug | Freq: Once | INTRAMUSCULAR | Status: AC
  Administered 2018-10-10: 50 ug via INTRAVENOUS
  Filled 2018-10-10: qty 1

## 2018-10-10 MED ORDER — FENTANYL CITRATE (PF) 100 MCG/2ML IJ SOLN
INTRAMUSCULAR | Status: DC | PRN
  Administered 2018-10-10: 50 ug via INTRAVENOUS

## 2018-10-10 MED ORDER — OXYCODONE HCL 5 MG PO TABS: 5.0000 mg | ORAL_TABLET | ORAL | 0 refills | Status: AC | PRN

## 2018-10-10 MED ORDER — FENTANYL CITRATE (PF) 100 MCG/2ML IJ SOLN
INTRAMUSCULAR | Status: AC
  Filled 2018-10-10: qty 2

## 2018-10-10 MED ORDER — ACETAMINOPHEN 500 MG PO TABS
ORAL_TABLET | ORAL | Status: AC
  Filled 2018-10-10: qty 2

## 2018-10-10 MED ORDER — CEFAZOLIN SODIUM-DEXTROSE 2-4 GM/100ML-% IV SOLN
INTRAVENOUS | Status: AC
  Filled 2018-10-10: qty 100

## 2018-10-10 MED ORDER — PROPOFOL 10 MG/ML IV BOLUS
INTRAVENOUS | Status: AC
  Filled 2018-10-10: qty 40

## 2018-10-10 MED ORDER — PROPOFOL 500 MG/50ML IV EMUL
INTRAVENOUS | Status: DC | PRN
  Administered 2018-10-10: 50 ug/kg/min via INTRAVENOUS

## 2018-10-10 MED ORDER — ACETAMINOPHEN 500 MG PO TABS: 1000.0000 mg | ORAL_TABLET | Freq: Three times a day (TID) | ORAL | 0 refills | Status: AC

## 2018-10-10 MED ORDER — BUPIVACAINE-EPINEPHRINE (PF) 0.5% -1:200000 IJ SOLN
INTRAMUSCULAR | Status: DC | PRN
  Administered 2018-10-10: 25 mL via PERINEURAL

## 2018-10-10 MED ORDER — ONDANSETRON HCL 4 MG PO TABS: 4.0000 mg | ORAL_TABLET | Freq: Three times a day (TID) | ORAL | 0 refills | Status: DC | PRN

## 2018-10-10 MED ORDER — ONDANSETRON HCL 4 MG/2ML IJ SOLN
INTRAMUSCULAR | Status: DC | PRN
  Administered 2018-10-10: 4 mg via INTRAVENOUS

## 2018-10-10 MED ORDER — LACTATED RINGERS IV SOLN
INTRAVENOUS | Status: DC
  Administered 2018-10-10: 1000 mL via INTRAVENOUS
  Filled 2018-10-10: qty 1000

## 2018-10-10 MED ORDER — GABAPENTIN 300 MG PO CAPS
300.0000 mg | ORAL_CAPSULE | Freq: Once | ORAL | Status: AC
  Administered 2018-10-10: 300 mg via ORAL
  Filled 2018-10-10: qty 1

## 2018-10-10 MED ORDER — CLONIDINE HCL (ANALGESIA) 100 MCG/ML EP SOLN
EPIDURAL | Status: DC | PRN
  Administered 2018-10-10: 50 ug

## 2018-10-10 MED ORDER — ACETAMINOPHEN 500 MG PO TABS
1000.0000 mg | ORAL_TABLET | Freq: Once | ORAL | Status: AC
  Administered 2018-10-10: 1000 mg via ORAL
  Filled 2018-10-10: qty 2

## 2018-10-10 MED ORDER — DEXAMETHASONE SODIUM PHOSPHATE 10 MG/ML IJ SOLN
INTRAMUSCULAR | Status: AC
  Filled 2018-10-10: qty 1

## 2018-10-10 MED ORDER — PROMETHAZINE HCL 25 MG/ML IJ SOLN
6.2500 mg | INTRAMUSCULAR | Status: DC | PRN
  Filled 2018-10-10: qty 1

## 2018-10-10 MED ORDER — ENSURE PRE-SURGERY PO LIQD
296.0000 mL | Freq: Once | ORAL | Status: DC
  Filled 2018-10-10: qty 296

## 2018-10-10 MED ORDER — GABAPENTIN 300 MG PO CAPS
ORAL_CAPSULE | ORAL | Status: AC
  Filled 2018-10-10: qty 1

## 2018-10-10 MED ORDER — LIDOCAINE HCL (CARDIAC) PF 100 MG/5ML IV SOSY
PREFILLED_SYRINGE | INTRAVENOUS | Status: DC | PRN
  Administered 2018-10-10: 60 mg via INTRAVENOUS

## 2018-10-10 MED ORDER — PROPOFOL 10 MG/ML IV BOLUS
INTRAVENOUS | Status: AC
  Filled 2018-10-10: qty 20

## 2018-10-10 MED ORDER — ONDANSETRON HCL 4 MG/2ML IJ SOLN
INTRAMUSCULAR | Status: AC
  Filled 2018-10-10: qty 2

## 2018-10-10 MED ORDER — CELECOXIB 100 MG PO CAPS: 100.0000 mg | ORAL_CAPSULE | Freq: Two times a day (BID) | ORAL | 0 refills | Status: AC | PRN

## 2018-10-10 MED ORDER — FENTANYL CITRATE (PF) 100 MCG/2ML IJ SOLN
100.0000 ug | Freq: Once | INTRAMUSCULAR | Status: DC
  Filled 2018-10-10: qty 2

## 2018-10-10 MED ORDER — GABAPENTIN 300 MG PO CAPS: 300.0000 mg | ORAL_CAPSULE | Freq: Two times a day (BID) | ORAL | 0 refills | Status: DC

## 2018-10-10 MED ORDER — LIDOCAINE 2% (20 MG/ML) 5 ML SYRINGE
INTRAMUSCULAR | Status: AC
  Filled 2018-10-10: qty 5

## 2018-10-10 MED ORDER — CEFAZOLIN SODIUM-DEXTROSE 2-4 GM/100ML-% IV SOLN
2.0000 g | INTRAVENOUS | Status: AC
  Administered 2018-10-10: 2 g via INTRAVENOUS
  Filled 2018-10-10: qty 100

## 2018-10-10 MED ORDER — FENTANYL CITRATE (PF) 100 MCG/2ML IJ SOLN
25.0000 ug | INTRAMUSCULAR | Status: DC | PRN
  Filled 2018-10-10: qty 1

## 2018-10-10 MED FILL — oxyCODONE HCL 5 MG TABS: 5 | 5 days supply | Qty: 30 | Fill #0

## 2018-10-10 MED FILL — CELECOXIB 100 MG CAP: 100 | 10 days supply | Qty: 20 | Fill #0

## 2018-10-10 MED FILL — ONDANSETRON HCL 4 MG TABLET: 4 | 7 days supply | Qty: 20 | Fill #0

## 2018-10-10 MED FILL — GABAPENTIN 300 MG CAPSULE: 300 | 14 days supply | Qty: 28 | Fill #0

## 2018-10-10 SURGICAL SUPPLY — 71 items
BIT DRILL 2.2 SS TIBIAL (BIT) ×2 IMPLANT
BLADE SURG 15 STRL LF DISP TIS (BLADE) ×2 IMPLANT
BLADE SURG 15 STRL SS (BLADE) ×2
BNDG ELASTIC 4X5.8 VLCR STR LF (GAUZE/BANDAGES/DRESSINGS) ×2 IMPLANT
BNDG ESMARK 4X9 LF (GAUZE/BANDAGES/DRESSINGS) ×2 IMPLANT
CHLORAPREP W/TINT 26 (MISCELLANEOUS) ×2 IMPLANT
CORD BIPOLAR FORCEPS 12FT (ELECTRODE) ×2 IMPLANT
COVER BACK TABLE 60X90IN (DRAPES) ×2 IMPLANT
COVER WAND RF STERILE (DRAPES) IMPLANT
CUFF TOURN SGL QUICK 18X4 (TOURNIQUET CUFF) ×2 IMPLANT
CUFF TOURN SGL QUICK 24 (TOURNIQUET CUFF)
CUFF TRNQT CYL 24X4X16.5-23 (TOURNIQUET CUFF) IMPLANT
DECANTER SPIKE VIAL GLASS SM (MISCELLANEOUS) IMPLANT
DRAPE EXTREMITY T 121X128X90 (DISPOSABLE) ×2 IMPLANT
DRAPE IMP U-DRAPE 54X76 (DRAPES) ×2 IMPLANT
DRAPE OEC MINIVIEW 54X84 (DRAPES) ×4 IMPLANT
DRAPE SURG 17X23 STRL (DRAPES) IMPLANT
DRSG EMULSION OIL 3X3 NADH (GAUZE/BANDAGES/DRESSINGS) ×2 IMPLANT
GAUZE SPONGE 4X4 12PLY STRL (GAUZE/BANDAGES/DRESSINGS) ×2 IMPLANT
GAUZE SPONGE 4X4 12PLY STRL LF (GAUZE/BANDAGES/DRESSINGS) ×2 IMPLANT
GAUZE XEROFORM 1X8 LF (GAUZE/BANDAGES/DRESSINGS) IMPLANT
GLOVE BIO SURGEON STRL SZ7.5 (GLOVE) ×4 IMPLANT
GLOVE BIOGEL PI IND STRL 8 (GLOVE) ×2 IMPLANT
GLOVE BIOGEL PI INDICATOR 8 (GLOVE) ×2
GOWN STRL REUS W/ TWL LRG LVL3 (GOWN DISPOSABLE) ×2 IMPLANT
GOWN STRL REUS W/TWL LRG LVL3 (GOWN DISPOSABLE) ×2
K-WIRE 1.6 (WIRE) ×2
K-WIRE FX5X1.6XNS BN SS (WIRE) ×2
KWIRE FX5X1.6XNS BN SS (WIRE) ×2 IMPLANT
NEEDLE HYPO 25X1 1.5 SAFETY (NEEDLE) ×2 IMPLANT
NS IRRIG 1000ML POUR BTL (IV SOLUTION) ×2 IMPLANT
PACK BASIN DAY SURGERY FS (CUSTOM PROCEDURE TRAY) ×2 IMPLANT
PAD CAST 4YDX4 CTTN HI CHSV (CAST SUPPLIES) ×1 IMPLANT
PADDING CAST ABS 4INX4YD NS (CAST SUPPLIES) ×1
PADDING CAST ABS COTTON 4X4 ST (CAST SUPPLIES) ×1 IMPLANT
PADDING CAST COTTON 4X4 STRL (CAST SUPPLIES) ×1
PEG LOCKING SMOOTH 2.2X18 (Peg) ×12 IMPLANT
PEG LOCKING SMOOTH 2.2X20 (Screw) ×2 IMPLANT
PLATE STANDARD DVR LEFT (Plate) ×2 IMPLANT
PLATE STD DVR LT 24X51 (Plate) ×1 IMPLANT
SCREW  LP NL 2.7X13MM (Screw) ×2 IMPLANT
SCREW  LP NL 2.7X15MM (Screw) ×2 IMPLANT
SCREW LOCK 12X2.7X 3 LD (Screw) ×1 IMPLANT
SCREW LOCKING 2.7X12MM (Screw) ×1 IMPLANT
SCREW LP NL 2.7X13MM (Screw) ×2 IMPLANT
SCREW LP NL 2.7X15MM (Screw) ×2 IMPLANT
SLEEVE SCD COMPRESS KNEE MED (MISCELLANEOUS) IMPLANT
SLING ARM FOAM STRAP MED (SOFTGOODS) ×2 IMPLANT
SPLINT FAST PLASTER 5X30 (CAST SUPPLIES)
SPLINT PLASTER CAST FAST 5X30 (CAST SUPPLIES) IMPLANT
SPLINT PLASTER CAST XFAST 3X15 (CAST SUPPLIES) IMPLANT
SPLINT PLASTER CAST XFAST 4X15 (CAST SUPPLIES) IMPLANT
SPLINT PLASTER XTRA FAST SET 4 (CAST SUPPLIES)
SPLINT PLASTER XTRA FASTSET 3X (CAST SUPPLIES)
SPONGE LAP 4X18 RFD (DISPOSABLE) ×2 IMPLANT
STRIP CLOSURE SKIN 1/2X4 (GAUZE/BANDAGES/DRESSINGS) ×2 IMPLANT
SUCTION FRAZIER HANDLE 10FR (MISCELLANEOUS) ×1
SUCTION TUBE FRAZIER 10FR DISP (MISCELLANEOUS) ×1 IMPLANT
SUT ETHILON 3 0 PS 1 (SUTURE) IMPLANT
SUT MON AB 2-0 CT1 36 (SUTURE) ×2 IMPLANT
SUT MON AB 4-0 PC3 18 (SUTURE) IMPLANT
SUT PROLENE 3 0 PS 2 (SUTURE) IMPLANT
SUT VIC AB 0 SH 27 (SUTURE) IMPLANT
SUT VIC AB 2-0 SH 27 (SUTURE)
SUT VIC AB 2-0 SH 27XBRD (SUTURE) IMPLANT
SUT VIC AB 3-0 FS2 27 (SUTURE) IMPLANT
SYR BULB 3OZ (MISCELLANEOUS) ×2 IMPLANT
SYR CONTROL 10ML LL (SYRINGE) ×2 IMPLANT
TOWEL OR 17X26 10 PK STRL BLUE (TOWEL DISPOSABLE) ×2 IMPLANT
TUBE CONNECTING 20X1/4 (TUBING) ×2 IMPLANT
UNDERPAD 30X30 (UNDERPADS AND DIAPERS) ×2 IMPLANT

## 2018-10-10 NOTE — Op Note (Signed)
10/10/2018  9:12 AM  PATIENT:  Lori Walsh    PRE-OPERATIVE DIAGNOSIS:  FRACTUE OF LOWER END OF LEFT RADIUS  POST-OPERATIVE DIAGNOSIS:  Same  PROCEDURE:  OPEN REDUCTION INTERNAL FIXATION (ORIF) WRIST FRACTURE  SURGEON:  Renette Butters, MD  ASSISTANT: Roxan Hockey, PA-C, he was present and scrubbed throughout the case, critical for completion in a timely fashion, and for retraction, instrumentation, and closure.   ANESTHESIA:   gen  PREOPERATIVE INDICATIONS:  [name] is a  63 y.o. female with a diagnosis of [place] END OF LEFT RADIUS who failed conservative measures and elected for surgical management.    The risks benefits and alternatives were discussed with the patient preoperatively including but not limited to the risks of infection, bleeding, nerve injury, cardiopulmonary complications, the need for revision surgery, among others, and the patient was willing to proceed.  OPERATIVE IMPLANTS: DVR plate  OPERATIVE FINDINGS: unstable fracture  BLOOD LOSS: min  COMPLICATIONS: none  TOURNIQUET TIME: 45  OPERATIVE PROCEDURE:  Patient was identified in the preoperative holding area and site was marked by me She was transported to the operating theater and placed on the table in supine position taking care to pad all bony prominences. After a preincinduction time out anesthesia was induced. The left upper extremity was prepped and draped in normal sterile fashion and a pre-incision timeout was performed. She received ancef for preoperative antibiotics.   I made a 5 cm incision centered over her FCR tendon and dissected down carefully to the level of the flexor tendon sheath and incise this longitudinally and retracted the FCR radially and incised the dorsal aspect of the sheath.   I bluntly dissected the FPL muscle belly away from the brachioradialis and then sharply incised the pronator tendon from the distal radius and from the wrist capsule. I Elevated this  off the bone the fractures visible.   I released the brachioradialis from its insertion. I then debrided the fracture and performed a manual reduction.   I selected a plate and I placed it on the bone. I pinned it into place and was happy on multiple radiographic views with it's placement. I then fixed the plate distally with the locking pegs. I confirmed no articular penetration with the pegs and that none were prominent dorsally.   I then reduced the plate to the shaft improving the volar and radial tilt of her distal radius.  I was happy with the final fluoro xrays    I thoroughly irrigated the wound and closed the pronator over top of the plate and then closed the skin in layers with absorbable stitch. Sterile dressing was applied using the PACU in stable condition.  POST OPERATIVE PLAN: NWB, Splint full time. Ambulate for DVT px.

## 2018-10-10 NOTE — Anesthesia Preprocedure Evaluation (Addendum)
Anesthesia Evaluation  Patient identified by MRN, date of birth, ID band Patient awake    Reviewed: Allergy & Precautions, NPO status , Patient's Chart, lab work & pertinent test results  Airway Mallampati: II  TM Distance: >3 FB     Dental  (+) Dental Advisory Given   Pulmonary neg pulmonary ROS,    breath sounds clear to auscultation       Cardiovascular hypertension, Pt. on medications  Rhythm:Regular Rate:Normal     Neuro/Psych negative neurological ROS     GI/Hepatic Neg liver ROS, GERD  ,  Endo/Other  negative endocrine ROS  Renal/GU negative Renal ROS     Musculoskeletal   Abdominal   Peds  Hematology negative hematology ROS (+)   Anesthesia Other Findings   Reproductive/Obstetrics                             Anesthesia Physical Anesthesia Plan  ASA: II  Anesthesia Plan: MAC and Regional   Post-op Pain Management:    Induction: Intravenous  PONV Risk Score and Plan: 2 and Ondansetron, Treatment may vary due to age or medical condition, Propofol infusion and Midazolam  Airway Management Planned: Natural Airway and Simple Face Mask  Additional Equipment:   Intra-op Plan:   Post-operative Plan:   Informed Consent: I have reviewed the patients History and Physical, chart, labs and discussed the procedure including the risks, benefits and alternatives for the proposed anesthesia with the patient or authorized representative who has indicated his/her understanding and acceptance.     Dental advisory given  Plan Discussed with: CRNA  Anesthesia Plan Comments:        Anesthesia Quick Evaluation

## 2018-10-10 NOTE — Anesthesia Procedure Notes (Signed)
Procedure Name: MAC Date/Time: 10/10/2018 9:55 AM Performed by: Suzette Battiest, MD Pre-anesthesia Checklist: Patient identified, Emergency Drugs available, Suction available, Patient being monitored and Timeout performed Placement Confirmation: positive ETCO2,  CO2 detector and breath sounds checked- equal and bilateral

## 2018-10-10 NOTE — Transfer of Care (Addendum)
  Last Vitals:  Vitals Value Taken Time  BP 110/58 10/10/18 1116  Temp    Pulse 71 10/10/18 1120  Resp 14 10/10/18 1120  SpO2 97 % 10/10/18 1120  Vitals shown include unvalidated device data.  Last Pain:  Vitals:   10/10/18 0925  TempSrc:   PainSc: 0-No pain      Patients Stated Pain Goal: 7 (10/10/18 0855)  Immediate Anesthesia Transfer of Care Note  Patient: Lori Walsh  Procedure(s) Performed: Procedure(s) (LRB): OPEN REDUCTION INTERNAL FIXATION (ORIF) WRIST FRACTURE (Left)  Patient Location: PACU  Anesthesia Type: MAC  Level of Consciousness: awake, alert  and oriented  Airway & Oxygen Therapy: Patient Spontanous Breathing and Patient connected to nasal cannula oxygen  Post-op Assessment: Report given to PACU RN and Post -op Vital signs reviewed and stable  Post vital signs: Reviewed and stable  Complications: No apparent anesthesia complications

## 2018-10-10 NOTE — Interval H&P Note (Signed)
I participated in the care of this patient and agree with the above history, physical and evaluation. I performed a review of the history and a physical exam as detailed   Sayre Witherington Daniel Ronda Kazmi MD  

## 2018-10-10 NOTE — Discharge Instructions (Signed)
Regional Anesthesia Blocks  1. Numbness or the inability to move the "blocked" extremity may last from 3-48 hours after placement. The length of time depends on the medication injected and your individual response to the medication. If the numbness is not going away after 48 hours, call your surgeon.  2. The extremity that is blocked will need to be protected until the numbness is gone and the  Strength has returned. Because you cannot feel it, you will need to take extra care to avoid injury. Because it may be weak, you may have difficulty moving it or using it. You may not know what position it is in without looking at it while the block is in effect.  3. For blocks in the legs and feet, returning to weight bearing and walking needs to be done carefully. You will need to wait until the numbness is entirely gone and the strength has returned. You should be able to move your leg and foot normally before you try and bear weight or walk. You will need someone to be with you when you first try to ensure you do not fall and possibly risk injury.  4. Bruising and tenderness at the needle site are common side effects and will resolve in a few days.  5. Persistent numbness or new problems with movement should be communicated to the surgeon or the [place] [phone] [place] [phone]). Post Anesthesia Home Care Instructions  Activity: Get plenty of rest for the remainder of the day. A responsible adult should stay with you for 24 hours following the procedure.  For the next 24 hours, DO NOT: -Drive a car -Paediatric nurse -Drink alcoholic beverages -Take any medication unless instructed by your physician -Make any legal decisions or sign important papers.  Meals: Start with liquid foods such as gelatin or soup. Progress to regular foods as tolerated. Avoid greasy, spicy, heavy foods. If nausea and/or vomiting occur, drink only clear liquids until the nausea  and/or vomiting subsides. Call your physician if vomiting continues.  Special Instructions/Symptoms: Your throat may feel dry or sore from the anesthesia or the breathing tube placed in your throat during surgery. If this causes discomfort, gargle with warm salt water. The discomfort should disappear within 24 hours.  If you had a scopolamine patch placed behind your ear for the management of post- operative nausea and/or vomiting:  1. The medication in the patch is effective for 72 hours, after which it should be removed.  Wrap patch in a tissue and discard in the trash. Wash hands thoroughly with soap and water. 2. You may remove the patch earlier than 72 hours if you experience unpleasant side effects which may include dry mouth, dizziness or visual disturbances. 3. Avoid touching the patch. Wash your hands with soap and water after contact with the patch.   Keep wrist elevated with ice as much as possible to reduce pain and swelling.  Stop as needed pain medication as soon as you are able.  Diet: As you were doing prior to hospitalization   Shower:  You have a splint on, leave the splint in place and keep the splint dry with a plastic bag.  Dressing:  You have a splint - leave the splint in place and we will change your bandages during your first follow-up appointment.    Activity:  Increase activity slowly as tolerated, but follow the weight bearing instructions below.  The rules on driving is that you can not be  taking narcotics while you drive, and you must feel in control of the vehicle.    Weight Bearing:  Non weight bearing affected wrist.  Sling for comfort.   To prevent constipation: you may use a stool softener such as -  Colace (over the counter) 100 mg by mouth twice a day  Drink plenty of fluids (prune juice may be helpful) and high fiber foods Miralax (over the counter) for constipation as needed.    Itching:  If you experience itching with your medications, try taking  only a single pain pill, or even half a pain pill at a time.  You can also use benadryl over the counter for itching or also to help with sleep.   Precautions:  If you experience chest pain or shortness of breath - call 911 immediately for transfer to the hospital emergency department!!  If you develop a fever greater that 101 F, purulent drainage from wound, increased redness or drainage from wound, or calf pain -- Call the office at [phone]                                         Follow- Up Appointment:  Please call for an appointment to be seen in 2 weeks [place] - [phone]

## 2018-10-10 NOTE — Anesthesia Procedure Notes (Signed)
Anesthesia Regional Block: Supraclavicular block   Pre-Anesthetic Checklist: ,, timeout performed, Correct Patient, Correct Site, Correct Laterality, Correct Procedure, Correct Position, site marked, Risks and benefits discussed,  Surgical consent,  Pre-op evaluation,  At surgeon's request and post-op pain management  Laterality: Left  Prep: chloraprep       Needles:  Injection technique: Single-shot  Needle Type: Echogenic Needle     Needle Length: 9cm  Needle Gauge: 21     Additional Needles:   Procedures:, nerve stimulator,,, ultrasound used (permanent image in chart),,,,   Nerve Stimulator or Paresthesia:  Response: biceps, triceps, 0.5 mA,   Additional Responses:   Narrative:  Start time: 10/10/2018 9:04 AM End time: 10/10/2018 9:10 AM Injection made incrementally with aspirations every 5 mL.  Performed by: Personally  Anesthesiologist: Suzette Battiest, MD

## 2018-10-10 NOTE — Progress Notes (Addendum)
Assisted Dr. Rob Fitzgerald with left, ultrasound guided, interscalene  block. Side rails up, monitors on throughout procedure. See vital signs in flow sheet. Tolerated Procedure well. 

## 2018-10-12 ENCOUNTER — Encounter (HOSPITAL_BASED_OUTPATIENT_CLINIC_OR_DEPARTMENT_OTHER): Payer: Self-pay | Admitting: Orthopedic Surgery

## 2018-10-13 NOTE — Anesthesia Postprocedure Evaluation (Signed)
Anesthesia Post Note  Patient: Lori Walsh  Procedure(s) Performed: OPEN REDUCTION INTERNAL FIXATION (ORIF) WRIST FRACTURE (Left Wrist)     Patient location during evaluation: PACU Anesthesia Type: General Level of consciousness: awake and alert Pain management: pain level controlled Vital Signs Assessment: post-procedure vital signs reviewed and stable Respiratory status: spontaneous breathing, nonlabored ventilation, respiratory function stable and patient connected to nasal cannula oxygen Cardiovascular status: blood pressure returned to baseline and stable Postop Assessment: no apparent nausea or vomiting Anesthetic complications: no    Last Vitals:  Vitals:   10/10/18 1155 10/10/18 1245  BP:  (!) 105/54  Pulse: 69 72  Resp: 11 16  Temp: 36.6 C (!) 36.2 C  SpO2: 95% 98%    Last Pain:  Vitals:   10/12/18 1834  TempSrc:   PainSc: 0-No pain                 Tiajuana Amass

## 2018-11-10 ENCOUNTER — Other Ambulatory Visit: Payer: Self-pay | Admitting: Gastroenterology

## 2018-11-20 MED FILL — FENOFIBRATE 160 MG TABLET: 160 | 90 days supply | Qty: 90 | Fill #0

## 2018-11-20 MED FILL — FLUoxetine HCL 40 MG CAPS: 40 | 90 days supply | Qty: 90 | Fill #0

## 2018-12-20 ENCOUNTER — Other Ambulatory Visit: Payer: Self-pay | Admitting: Gastroenterology

## 2018-12-21 ENCOUNTER — Other Ambulatory Visit (HOSPITAL_COMMUNITY)
Admission: RE | Admit: 2018-12-21 | Discharge: 2018-12-21 | Disposition: A | Discharge status: Home / Self Care | Payer: Medicare Other | Source: Ambulatory Visit | Attending: Gastroenterology | Admitting: Gastroenterology

## 2018-12-21 DIAGNOSIS — Z20828 Contact with and (suspected) exposure to other viral communicable diseases: Secondary | ICD-10-CM | POA: Insufficient documentation

## 2018-12-21 DIAGNOSIS — Z01812 Encounter for preprocedural laboratory examination: Secondary | ICD-10-CM | POA: Diagnosis present

## 2018-12-23 LAB — NOVEL CORONAVIRUS, NAA (HOSP ORDER, SEND-OUT TO REF LAB; TAT 18-24 HRS): SARS-CoV-2, NAA: NOT DETECTED

## 2018-12-28 MED FILL — LISINOPRIL 20 MG TABLET: 20 | 90 days supply | Qty: 90 | Fill #1

## 2019-01-03 MED FILL — ACCU-CHEK FASTCLIX LANCETS: 90 days supply | Qty: 102 | Fill #0

## 2019-01-03 MED FILL — ACCU-CHEK GUIDE STRP: 90 days supply | Qty: 100 | Fill #0

## 2019-01-16 ENCOUNTER — Other Ambulatory Visit: Payer: Self-pay | Admitting: Gastroenterology

## 2019-01-17 MED FILL — GOLYTELY SOLUTION: 236 | 1 days supply | Qty: 4000 | Fill #0

## 2019-01-18 ENCOUNTER — Other Ambulatory Visit (HOSPITAL_COMMUNITY)
Admission: RE | Admit: 2019-01-18 | Discharge: 2019-01-18 | Disposition: A | Discharge status: Home / Self Care | Payer: Medicare Other | Source: Ambulatory Visit | Attending: Gastroenterology | Admitting: Gastroenterology

## 2019-01-18 DIAGNOSIS — Z20828 Contact with and (suspected) exposure to other viral communicable diseases: Secondary | ICD-10-CM | POA: Diagnosis not present

## 2019-01-18 DIAGNOSIS — Z01812 Encounter for preprocedural laboratory examination: Secondary | ICD-10-CM | POA: Insufficient documentation

## 2019-01-19 ENCOUNTER — Other Ambulatory Visit: Payer: Self-pay | Admitting: Family Medicine

## 2019-01-19 DIAGNOSIS — Z1231 Encounter for screening mammogram for malignant neoplasm of breast: Secondary | ICD-10-CM

## 2019-01-19 NOTE — Progress Notes (Signed)
Attempted to leave message for patient, but voicemail did not identify patient.

## 2019-01-20 LAB — NOVEL CORONAVIRUS, NAA (HOSP ORDER, SEND-OUT TO REF LAB; TAT 18-24 HRS): SARS-CoV-2, NAA: NOT DETECTED

## 2019-01-22 ENCOUNTER — Encounter (HOSPITAL_COMMUNITY)
Admission: RE | Disposition: A | Discharge status: Home / Self Care | Payer: Self-pay | Source: Ambulatory Visit | Attending: Gastroenterology

## 2019-01-22 ENCOUNTER — Encounter (HOSPITAL_COMMUNITY): Payer: Self-pay | Admitting: Registered Nurse

## 2019-01-22 ENCOUNTER — Ambulatory Visit (HOSPITAL_COMMUNITY)
Admission: RE | Admit: 2019-01-22 | Discharge: 2019-01-22 | Disposition: A | Discharge status: Home / Self Care | Payer: No Typology Code available for payment source | Source: Ambulatory Visit | Attending: Gastroenterology | Admitting: Gastroenterology

## 2019-01-22 ENCOUNTER — Other Ambulatory Visit: Payer: Self-pay

## 2019-01-22 ENCOUNTER — Ambulatory Visit (HOSPITAL_COMMUNITY): Payer: No Typology Code available for payment source | Admitting: Registered Nurse

## 2019-01-22 DIAGNOSIS — F329 Major depressive disorder, single episode, unspecified: Secondary | ICD-10-CM | POA: Diagnosis not present

## 2019-01-22 DIAGNOSIS — I10 Essential (primary) hypertension: Secondary | ICD-10-CM | POA: Diagnosis not present

## 2019-01-22 DIAGNOSIS — Z79899 Other long term (current) drug therapy: Secondary | ICD-10-CM | POA: Insufficient documentation

## 2019-01-22 DIAGNOSIS — K625 Hemorrhage of anus and rectum: Secondary | ICD-10-CM | POA: Diagnosis not present

## 2019-01-22 DIAGNOSIS — K640 First degree hemorrhoids: Secondary | ICD-10-CM | POA: Insufficient documentation

## 2019-01-22 DIAGNOSIS — Z7982 Long term (current) use of aspirin: Secondary | ICD-10-CM | POA: Insufficient documentation

## 2019-01-22 HISTORY — PX: COLONOSCOPY WITH PROPOFOL: SHX5780

## 2019-01-22 SURGERY — COLONOSCOPY WITH PROPOFOL
Anesthesia: Monitor Anesthesia Care

## 2019-01-22 MED ORDER — PROPOFOL 500 MG/50ML IV EMUL
INTRAVENOUS | Status: AC
  Filled 2019-01-22: qty 50

## 2019-01-22 MED ORDER — SODIUM CHLORIDE 0.9 % IV SOLN: INTRAVENOUS | Status: DC

## 2019-01-22 MED ORDER — PROPOFOL 10 MG/ML IV BOLUS
INTRAVENOUS | Status: DC | PRN
  Administered 2019-01-22 (×2): 20 mg via INTRAVENOUS
  Administered 2019-01-22: 30 mg via INTRAVENOUS
  Administered 2019-01-22 (×5): 20 mg via INTRAVENOUS
  Administered 2019-01-22: 30 mg via INTRAVENOUS
  Administered 2019-01-22: 10 mg via INTRAVENOUS
  Administered 2019-01-22: 20 mg via INTRAVENOUS

## 2019-01-22 MED ORDER — LIDOCAINE 2% (20 MG/ML) 5 ML SYRINGE
INTRAMUSCULAR | Status: DC | PRN
  Administered 2019-01-22: 60 mg via INTRAVENOUS

## 2019-01-22 MED ORDER — LACTATED RINGERS IV SOLN
INTRAVENOUS | Status: DC
  Administered 2019-01-22: 10:00:00 via INTRAVENOUS

## 2019-01-22 SURGICAL SUPPLY — 21 items

## 2019-01-22 NOTE — Anesthesia Preprocedure Evaluation (Signed)
Anesthesia Evaluation  Patient identified by MRN, date of birth, ID band Patient awake    Reviewed: Allergy & Precautions, NPO status , Patient's Chart, lab work & pertinent test results  Airway Mallampati: II  TM Distance: >3 FB     Dental  (+) Teeth Intact   Pulmonary neg pulmonary ROS,    Pulmonary exam normal        Cardiovascular hypertension, Pt. on medications Normal cardiovascular exam     Neuro/Psych PSYCHIATRIC DISORDERS Depression negative neurological ROS     GI/Hepatic Neg liver ROS, GERD  ,  Endo/Other  negative endocrine ROS  Renal/GU negative Renal ROS     Musculoskeletal   Abdominal   Peds  Hematology negative hematology ROS (+)   Anesthesia Other Findings   Reproductive/Obstetrics                             Anesthesia Physical  Anesthesia Plan  ASA: II  Anesthesia Plan: MAC   Post-op Pain Management:    Induction: Intravenous  PONV Risk Score and Plan: 2 and Treatment may vary due to age or medical condition, Propofol infusion and TIVA  Airway Management Planned: Natural Airway, Simple Face Mask and Nasal Cannula  Additional Equipment: None  Intra-op Plan:   Post-operative Plan:   Informed Consent: I have reviewed the patients History and Physical, chart, labs and discussed the procedure including the risks, benefits and alternatives for the proposed anesthesia with the patient or authorized representative who has indicated his/her understanding and acceptance.       Plan Discussed with:   Anesthesia Plan Comments:         Anesthesia Quick Evaluation

## 2019-01-22 NOTE — Anesthesia Procedure Notes (Signed)
Date/Time: 01/22/2019 10:23 AM Performed by: Talbot Grumbling, CRNA Oxygen Delivery Method: Simple face mask

## 2019-01-22 NOTE — Anesthesia Postprocedure Evaluation (Signed)
Anesthesia Post Note  Patient: Lori Walsh  Procedure(s) Performed: COLONOSCOPY WITH PROPOFOL (N/A )     Patient location during evaluation: Endoscopy Anesthesia Type: MAC Level of consciousness: awake and alert Pain management: pain level controlled Vital Signs Assessment: post-procedure vital signs reviewed and stable Respiratory status: spontaneous breathing, nonlabored ventilation and respiratory function stable Cardiovascular status: blood pressure returned to baseline and stable Postop Assessment: no apparent nausea or vomiting Anesthetic complications: no    Last Vitals:  Vitals:   01/22/19 1100 01/22/19 1107  BP: (!) 111/49 116/60  Pulse: 67 65  Resp: 14 13  Temp:    SpO2: 99% 100%    Last Pain:  Vitals:   01/22/19 1050  TempSrc: Oral  PainSc: 0-No pain                 Lidia Collum

## 2019-01-22 NOTE — Op Note (Signed)
[facility] Patient Name: Lori Walsh Procedure Date: 01/22/2019 MRN: [id number] Attending MD: Lear Ng , MD Date of Birth: 1956-02-02 CSN: [id number] Age: 63 Admit Type: Outpatient Procedure:                Colonoscopy Indications:              Last colonoscopy: December 2012, Rectal bleeding Providers:                Lear Ng, MD, Cleda Daub, RN, Ladona Ridgel, Technician Referring MD:             Rudean Hitt MD Medicines:                Propofol per Anesthesia, Monitored Anesthesia Care Complications:            No immediate complications. Estimated Blood Loss:     Estimated blood loss: none. Procedure:                Pre-Anesthesia Assessment:                           - Prior to the procedure, a History and Physical                            was performed, and patient medications and                            allergies were reviewed. The patient's tolerance of                            previous anesthesia was also reviewed. The risks                            and benefits of the procedure and the sedation                            options and risks were discussed with the patient.                            All questions were answered, and informed consent                            was obtained. Prior Anticoagulants: The patient has                            taken no previous anticoagulant or antiplatelet                            agents. ASA Grade Assessment: II - A patient with                            mild systemic disease. After reviewing the risks  and benefits, the patient was deemed in                            satisfactory condition to undergo the procedure.                           After obtaining informed consent, the colonoscope                            was passed under direct vision. Throughout the                            procedure, the patient's  blood pressure, pulse, and                            oxygen saturations were monitored continuously. The                            [id number] Olympus pediatric colonscope                            was introduced through the anus and advanced to the                            the cecum, identified by appendiceal orifice and                            ileocecal valve. The colonoscopy was performed                            without difficulty. The patient tolerated the                            procedure well. The quality of the bowel                            preparation was fair and fair but repeated                            irrigation led to a good and adequate prep. The                            terminal ileum, the appendiceal orifice and the                            rectum were photographed. Scope In: 10:28:34 AM Scope Out: 10:43:46 AM Scope Withdrawal Time: 0 hours 8 minutes 19 seconds  Total Procedure Duration: 0 hours 15 minutes 12 seconds  Findings:      The perianal and digital rectal examinations were normal.      Internal hemorrhoids were found during retroflexion. The hemorrhoids       were medium-sized and Grade I (internal hemorrhoids that do not       prolapse).      The exam was otherwise normal throughout the  examined colon.      The terminal ileum appeared normal. Impression:               - Preparation of the colon was fair.                           - Internal hemorrhoids.                           - The examined portion of the ileum was normal.                           - No specimens collected. Moderate Sedation:      Not Applicable - Patient had care per Anesthesia. Recommendation:           - Patient has a contact number available for                            emergencies. The signs and symptoms of potential                            delayed complications were discussed with the                            patient. Return to normal  activities tomorrow.                            Written discharge instructions were provided to the                            patient.                           - High fiber diet.                           - Continue present medications.                           - Repeat colonoscopy in 10 years for screening                            purposes. Procedure Code(s):        --- Professional ---                           [phone], Colonoscopy, flexible; diagnostic, including                            collection of specimen(s) by brushing or washing,                            when performed (separate procedure) Diagnosis Code(s):        --- Professional ---                           K62.5, Hemorrhage of anus and rectum  K64.0, First degree hemorrhoids CPT copyright 2019 American Medical Association. All rights reserved. The codes documented in this report are preliminary and upon coder review may  be revised to meet current compliance requirements. Lear Ng, MD 01/22/2019 10:55:35 AM This report has been signed electronically. Number of Addenda: 0

## 2019-01-22 NOTE — Transfer of Care (Signed)
Immediate Anesthesia Transfer of Care Note  Patient: Lori Walsh  Procedure(s) Performed: COLONOSCOPY WITH PROPOFOL (N/A )  Patient Location: PACU  Anesthesia Type:MAC  Level of Consciousness: sedated  Airway & Oxygen Therapy: Patient Spontanous Breathing and Patient connected to face mask oxygen  Post-op Assessment: Report given to RN and Post -op Vital signs reviewed and stable  Post vital signs: Reviewed and stable  Last Vitals:  Vitals Value Taken Time  BP    Temp    Pulse    Resp    SpO2      Last Pain:  Vitals:   01/22/19 0901  TempSrc: Oral  PainSc: 0-No pain         Complications: No apparent anesthesia complications

## 2019-01-22 NOTE — Interval H&P Note (Signed)
History and Physical Interval Note:  01/22/2019 10:18 AM  Lori Walsh  has presented today for surgery, with the diagnosis of rectal bleeding.  The various methods of treatment have been discussed with the patient and family. After consideration of risks, benefits and other options for treatment, the patient has consented to  Procedure(s): COLONOSCOPY WITH PROPOFOL (N/A) as a surgical intervention.  The patient's history has been reviewed, patient examined, no change in status, stable for surgery.  I have reviewed the patient's chart and labs.  Questions were answered to the patient's satisfaction.     Lear Ng

## 2019-01-22 NOTE — Discharge Instructions (Signed)

## 2019-01-22 NOTE — H&P (Signed)
Date of Initial H&P: 01/16/19  History reviewed, patient examined, no change in status, stable for surgery.  

## 2019-01-25 ENCOUNTER — Encounter (HOSPITAL_COMMUNITY): Payer: Self-pay | Admitting: Gastroenterology

## 2019-02-08 ENCOUNTER — Ambulatory Visit
Admission: RE | Admit: 2019-02-08 | Discharge: 2019-02-08 | Disposition: A | Discharge status: Home / Self Care | Payer: No Typology Code available for payment source | Source: Ambulatory Visit | Attending: Family Medicine | Admitting: Family Medicine

## 2019-02-08 ENCOUNTER — Other Ambulatory Visit: Payer: Self-pay

## 2019-02-08 DIAGNOSIS — Z1231 Encounter for screening mammogram for malignant neoplasm of breast: Secondary | ICD-10-CM

## 2019-02-26 MED FILL — FLUoxetine HCL 40 MG CAPS: 40 | 90 days supply | Qty: 90 | Fill #1

## 2019-02-27 MED FILL — FENOFIBRATE 160 MG TABLET: 160 | 90 days supply | Qty: 90 | Fill #0

## 2019-03-13 MED FILL — LISINOPRIL 20 MG TABLET: 20 | 90 days supply | Qty: 90 | Fill #0

## 2019-03-15 MED FILL — FLUoxetine HCL 40 MG CAPS: 40 | 90 days supply | Qty: 90 | Fill #0

## 2019-03-26 MED FILL — FREESTYLE LITE METER: 30 days supply | Qty: 1 | Fill #0

## 2019-03-26 MED FILL — FREESTYLE LITE TEST STRIP: 90 days supply | Qty: 100 | Fill #0

## 2019-03-26 MED FILL — FREESTYLE LANCETS: 90 days supply | Qty: 100 | Fill #0

## 2019-04-04 MED FILL — FREESTYLE LANCETS: 90 days supply | Qty: 100 | Fill #0

## 2019-04-04 MED FILL — FREESTYLE LITE METER: 30 days supply | Qty: 1 | Fill #0

## 2019-04-04 MED FILL — FREESTYLE LITE TEST STRIP: 90 days supply | Qty: 100 | Fill #0

## 2019-04-12 ENCOUNTER — Other Ambulatory Visit (HOSPITAL_COMMUNITY)
Admission: RE | Admit: 2019-04-12 | Discharge: 2019-04-12 | Disposition: A | Discharge status: Home / Self Care | Payer: 59 | Source: Ambulatory Visit | Attending: Family Medicine | Admitting: Family Medicine

## 2019-04-12 ENCOUNTER — Other Ambulatory Visit: Payer: Self-pay | Admitting: Family Medicine

## 2019-04-12 DIAGNOSIS — Z124 Encounter for screening for malignant neoplasm of cervix: Secondary | ICD-10-CM | POA: Insufficient documentation

## 2019-04-12 DIAGNOSIS — R102 Pelvic and perineal pain: Secondary | ICD-10-CM | POA: Diagnosis not present

## 2019-04-16 ENCOUNTER — Encounter: Payer: Self-pay | Admitting: Obstetrics & Gynecology

## 2019-04-16 LAB — CYTOLOGY - PAP: Diagnosis: NEGATIVE

## 2019-04-30 ENCOUNTER — Other Ambulatory Visit: Payer: Self-pay

## 2019-04-30 ENCOUNTER — Encounter: Payer: Self-pay | Admitting: Obstetrics & Gynecology

## 2019-04-30 ENCOUNTER — Ambulatory Visit (INDEPENDENT_AMBULATORY_CARE_PROVIDER_SITE_OTHER): Payer: 59 | Admitting: Obstetrics & Gynecology

## 2019-04-30 VITALS — BP 136/72 | HR 76 | Temp 97.2°F | Ht 62.75 in | Wt 181.0 lb

## 2019-04-30 DIAGNOSIS — R8290 Unspecified abnormal findings in urine: Secondary | ICD-10-CM | POA: Diagnosis not present

## 2019-04-30 DIAGNOSIS — R32 Unspecified urinary incontinence: Secondary | ICD-10-CM

## 2019-04-30 DIAGNOSIS — R1900 Intra-abdominal and pelvic swelling, mass and lump, unspecified site: Secondary | ICD-10-CM

## 2019-04-30 DIAGNOSIS — Z01419 Encounter for gynecological examination (general) (routine) without abnormal findings: Secondary | ICD-10-CM

## 2019-04-30 LAB — POCT URINALYSIS DIPSTICK
Bilirubin, UA: NEGATIVE
Glucose, UA: NEGATIVE
Ketones, UA: NEGATIVE
Nitrite, UA: NEGATIVE
Protein, UA: NEGATIVE
Urobilinogen, UA: 0.2 E.U./dL
pH, UA: 5 (ref 5.0–8.0)

## 2019-04-30 MED ORDER — NITROFURANTOIN MONOHYD MACRO 100 MG PO CAPS: 100.0000 mg | ORAL_CAPSULE | Freq: Two times a day (BID) | ORAL | 0 refills | Status: DC

## 2019-04-30 MED FILL — NITROFURANTOIN MONO-MCR 100: 100 | 5 days supply | Qty: 10 | Fill #0

## 2019-04-30 NOTE — Progress Notes (Signed)
64 y.o. [id number] Married White or Caucasian female here as a new patient for an annual exam. Patient complains of having urinary incontinence and a "fullness" in lower abdomen.  Reports this is worse over the last three months.  Denies dysuria.    Denies vaginal bleeding.    Having painful intercourse.  Having pain with insertion.  She is not sure there is dryness.    Urine: trace RBC, 1+ WBC  Patient's last menstrual period was 03/08/2009 (approximate).          Sexually active: No.  The current method of family planning is post menopausal status.    Exercising: Yes.    walking Smoker:  no  Health Maintenance: Pap:  04/12/19 Neg  07/15/16 Neg History of abnormal Pap:  no MMG:  02/08/19 BIRADS 1 negative/density b Colonoscopy:  01/22/19 f/u 10 years, Dr. Michail Sermon.   BMD:   Years ago - normal per patient  TDaP:  Unsure, hospital employee Pneumonia vaccine(s):  never Shingrix:   completed Hep C testing: negative in the past Screening Labs: PCP   reports that she has never smoked. She has never used smokeless tobacco. She reports previous alcohol use. She reports that she does not use drugs.  Past Medical History:  Diagnosis Date  . Depression   . Fibroadenoma of right breast   . GERD (gastroesophageal reflux disease)   . History of gestational diabetes   . History of nuclear stress test    04-16-2014  normal w/ no ischemia , ef 82% (in epic)  . Hyperlipidemia   . Hypertension   . Left wrist fracture   . Pre-diabetes   . Thyroid nodule    bilateral --- previous bx , benign/   last ultrasound in epic 08-19-2017  . TMJ syndrome     Past Surgical History:  Procedure Laterality Date  . BREAST EXCISIONAL BIOPSY Right 1995   benign  . COLONOSCOPY  last one 2007  . COLONOSCOPY WITH PROPOFOL N/A 01/22/2019   Procedure: COLONOSCOPY WITH PROPOFOL;  Surgeon: Wilford Corner, MD;  Location: [facility];  Service: Endoscopy;  Laterality: N/A;  . [place]  . ORIF WRIST  FRACTURE Left 10/10/2018   Procedure: OPEN REDUCTION INTERNAL FIXATION (ORIF) WRIST FRACTURE;  Surgeon: Renette Butters, MD;  Location: [facility];  Service: Orthopedics;  Laterality: Left;  . TUBAL LIGATION Bilateral 1987    Current Outpatient Medications  Medication Sig Dispense Refill  . aspirin EC 81 MG tablet Take 81 mg by mouth at bedtime.     . calcium carbonate (TUMS - DOSED IN MG ELEMENTAL CALCIUM) 500 MG chewable tablet Chew 1 tablet by mouth as needed for indigestion or heartburn.    . cholecalciferol (VITAMIN D) 1000 UNITS tablet Take 1,000 Units by mouth daily.    . fenofibrate 160 MG tablet Take 160 mg by mouth daily.    Marland Kitchen FLUoxetine (PROZAC) 40 MG capsule Take 40 mg by mouth daily.    . fluticasone (FLONASE) 50 MCG/ACT nasal spray Place 2 sprays into both nostrils 2 (two) times daily. (Patient taking differently: Place 2 sprays into both nostrils 2 (two) times daily as needed for allergies. ) 15.8 g 0  . glucosamine-chondroitin 500-400 MG tablet Take 1 tablet by mouth daily.     Marland Kitchen glucose blood test strip     . Lancets (FREESTYLE) lancets     . lisinopril (PRINIVIL,ZESTRIL) 20 MG tablet Take 20 mg by mouth at bedtime.     Marland Kitchen  Multiple Vitamin (MULTIVITAMIN) tablet Take 1 tablet by mouth daily.    Marland Kitchen trolamine salicylate (ASPERCREME) 10 % cream Apply 1 application topically as needed for muscle pain (hip).     No current facility-administered medications for this visit.    Family History  Problem Relation Age of Onset  . Aneurysm Mother   . Hypertension Mother   . Parkinson's disease Father   . Hypertension Father   . Hypertension Sister   . Alcoholism Sister   . Cancer Maternal Grandmother        pancreatic  . Heart attack Maternal Grandfather   . Pneumonia Paternal Grandmother   . Esophageal cancer Paternal Grandfather   . HIV Son   . Healthy Daughter     Review of Systems  Genitourinary:       Urinary incontinence Fullness in lower abdomen   All other systems reviewed and are negative.   Exam:   BP 136/72 (BP Location: Right Arm, Patient Position: Sitting, Cuff Size: Normal)   Pulse 76   Temp (!) 97.2 F (36.2 C) (Temporal)   Ht 5' 2.75" (1.594 m)   Wt 181 lb (82.1 kg)   LMP 03/08/2009 (Approximate)   BMI 32.32 kg/m   Height: 5' 2.75" (159.4 cm)  Ht Readings from Last 3 Encounters:  04/30/19 5' 2.75" (1.594 m)  01/22/19 5\' 4"  (1.626 m)  10/10/18 5\' 4"  (1.626 m)    General appearance: alert, cooperative and appears stated age Head: Normocephalic, without obvious abnormality, atraumatic Neck: no adenopathy, supple, symmetrical, trachea midline and thyroid normal to inspection and palpation Lungs: clear to auscultation bilaterally Breasts: normal appearance, no masses or tenderness Heart: regular rate and rhythm Abdomen: soft, non-tender; bowel sounds normal; no masses,  no organomegaly Extremities: extremities normal, atraumatic, no cyanosis or edema Skin: Skin color, texture, turgor normal. No rashes or lesions Lymph nodes: Cervical, supraclavicular, and axillary nodes normal. No abnormal inguinal nodes palpated Neurologic: Grossly normal  Pelvic: External genitalia:  no lesions              Urethra:  normal appearing urethra with no masses, tenderness or lesions              Bartholins and Skenes: normal                 Vagina: normal appearing vagina with normal color and discharge, no lesions              Cervix: no lesions              Pap taken: No. Bimanual Exam:  Uterus:  normal size, contour, position, consistency, mobility, non-tender              Adnexa: normal adnexa and no mass, fullness, tenderness               Rectal exam deferred due ot recent colonoscopy               Anus:  normal sphincter tone, no lesions  Chaperone, Terence Lux, CMA, was present for exam.  A:  Well Woman with normal exam PMP, no HRT Dyspareunia and pelvic fullness Abnormal dip u/a in office today Recent onset  urinary incontinence Hypertension Elevated lipids Prediabetes  P:   Mammogram guidelines reviewed pap smear neg earlier this month with Dr. Drema Dallas Colonoscopy neg 01/2019 Consider BMD with next MMG Urine culture pending.  Rx for macrobid 100mg  bid x 5 days. Consider vaginal estrogen and possible PUS if this does  not resolve symptoms Screening blood work done with Dr. Drema Dallas. Return annually or prn

## 2019-05-01 LAB — URINE CULTURE

## 2019-05-03 ENCOUNTER — Other Ambulatory Visit: Payer: Self-pay

## 2019-05-03 MED ORDER — ESTRADIOL 0.1 MG/GM VA CREA: 1.0000 | TOPICAL_CREAM | VAGINAL | 1 refills | Status: DC

## 2019-05-03 MED FILL — ESTRADIOL 0.1 MG/GM CREA: 0.1 | 90 days supply | Qty: 43 | Fill #0

## 2019-05-29 DIAGNOSIS — H2513 Age-related nuclear cataract, bilateral: Secondary | ICD-10-CM | POA: Diagnosis not present

## 2019-05-29 DIAGNOSIS — H40013 Open angle with borderline findings, low risk, bilateral: Secondary | ICD-10-CM | POA: Diagnosis not present

## 2019-06-11 MED FILL — LISINOPRIL 20 MG TABLET: 20 | 90 days supply | Qty: 90 | Fill #1

## 2019-06-12 MED FILL — FLUoxetine HCL 40 MG CAPS: 40 | 90 days supply | Qty: 90 | Fill #1

## 2019-06-26 MED FILL — FENOFIBRATE 160 MG TABLET: 160 | 90 days supply | Qty: 90 | Fill #1

## 2019-06-29 ENCOUNTER — Telehealth: Payer: Self-pay | Admitting: Obstetrics & Gynecology

## 2019-06-29 NOTE — Telephone Encounter (Signed)
Patient canceled her 2 month recheck appointment 07/03/19. She will call later to reschedule.

## 2019-07-03 ENCOUNTER — Ambulatory Visit: Payer: 59 | Admitting: Obstetrics & Gynecology

## 2019-08-20 DIAGNOSIS — Z23 Encounter for immunization: Secondary | ICD-10-CM | POA: Diagnosis not present

## 2019-08-20 DIAGNOSIS — Z0000 Encounter for general adult medical examination without abnormal findings: Secondary | ICD-10-CM | POA: Diagnosis not present

## 2019-08-20 DIAGNOSIS — F419 Anxiety disorder, unspecified: Secondary | ICD-10-CM | POA: Diagnosis not present

## 2019-08-20 DIAGNOSIS — R5383 Other fatigue: Secondary | ICD-10-CM | POA: Diagnosis not present

## 2019-08-20 DIAGNOSIS — I10 Essential (primary) hypertension: Secondary | ICD-10-CM | POA: Diagnosis not present

## 2019-08-20 DIAGNOSIS — E042 Nontoxic multinodular goiter: Secondary | ICD-10-CM | POA: Diagnosis not present

## 2019-08-20 DIAGNOSIS — F339 Major depressive disorder, recurrent, unspecified: Secondary | ICD-10-CM | POA: Diagnosis not present

## 2019-08-20 DIAGNOSIS — R7303 Prediabetes: Secondary | ICD-10-CM | POA: Diagnosis not present

## 2019-08-20 DIAGNOSIS — K219 Gastro-esophageal reflux disease without esophagitis: Secondary | ICD-10-CM | POA: Diagnosis not present

## 2019-08-20 DIAGNOSIS — E782 Mixed hyperlipidemia: Secondary | ICD-10-CM | POA: Diagnosis not present

## 2019-08-20 DIAGNOSIS — E559 Vitamin D deficiency, unspecified: Secondary | ICD-10-CM | POA: Diagnosis not present

## 2019-09-17 ENCOUNTER — Other Ambulatory Visit (HOSPITAL_COMMUNITY): Payer: Self-pay | Admitting: Family Medicine

## 2019-09-17 MED FILL — FLUoxetine HCL 40 MG CAPS: 40 | 90 days supply | Qty: 90 | Fill #0

## 2019-09-17 MED FILL — FENOFIBRATE 160 MG TABLET: 160 | 90 days supply | Qty: 90 | Fill #0

## 2019-09-17 MED FILL — LISINOPRIL 20 MG TABLET: 20 | 90 days supply | Qty: 90 | Fill #0

## 2019-11-27 DIAGNOSIS — H40013 Open angle with borderline findings, low risk, bilateral: Secondary | ICD-10-CM | POA: Diagnosis not present

## 2019-11-27 DIAGNOSIS — H2513 Age-related nuclear cataract, bilateral: Secondary | ICD-10-CM | POA: Diagnosis not present

## 2019-11-27 DIAGNOSIS — H0102B Squamous blepharitis left eye, upper and lower eyelids: Secondary | ICD-10-CM | POA: Diagnosis not present

## 2019-11-27 DIAGNOSIS — H43813 Vitreous degeneration, bilateral: Secondary | ICD-10-CM | POA: Diagnosis not present

## 2019-11-27 DIAGNOSIS — H0102A Squamous blepharitis right eye, upper and lower eyelids: Secondary | ICD-10-CM | POA: Diagnosis not present

## 2019-12-13 ENCOUNTER — Other Ambulatory Visit: Payer: Self-pay | Admitting: Family Medicine

## 2019-12-13 DIAGNOSIS — Z1231 Encounter for screening mammogram for malignant neoplasm of breast: Secondary | ICD-10-CM

## 2019-12-17 MED FILL — LISINOPRIL 20 MG TABLET: 20 | 90 days supply | Qty: 90 | Fill #1

## 2020-01-05 ENCOUNTER — Telehealth: Payer: 59 | Admitting: Emergency Medicine

## 2020-01-05 DIAGNOSIS — S61411A Laceration without foreign body of right hand, initial encounter: Secondary | ICD-10-CM

## 2020-01-05 NOTE — Progress Notes (Signed)
Time spent: 10 min  E visit for Simple Cut/Laceration  We are sorry that you have had an injury. Here is how we plan to help!  Based on what you shared with me it looks like you have a simple laceration that does not need to be repaired with stitches or tissue glue.  Please note that e-visit survey questionnaire is limited - but as long as you have full range of motion and sensation of your nearby fingers, and wrist this laceration will heal on its own.  Also, laceration in creases of the hands, fingers or over a joint may take longer to heal than usual because of the constant tension they are under during movement of the affected extremity.    HOME CARE: . Clean the cut or scrape - Wash it well with soap and water. * avoid using hydrogen peroxide which may cause tissue damage, or impede wound healing.  . Stop the bleeding - If your cut or scrape is bleeding, press a clean cloth or bandage firmly on the area for 20 minutes. You can also help slow the bleeding by holding the cut above the level of your heart.   . Put a thin layer of Bacitracin antibiotic ointment on the cut or scrape. (this can be purchased at any local pharmacy- ask your pharmacist if you need assistance)   . Cover the cut or scrape with a bandage or gauze. Keep the bandage clean and dry. Change the bandage 1 to 2 times every day until your cut or scrape heals.   . Watch for signs that your cut or scrape is infected (redness, drainage, pain, warmth, swelling or fever)  Over the next 48 hours your wound should start to improve with less pain, less swelling and less redness. If you should develop increasing pain, swelling, redness, fever, pus from the wound you should be seen immediately to make sure this is not becoming infected.   WOUND CARE: Please keep a layer of antibiotic ointment (bacitracin preferred) on this wound at least twice a day for the next seven days and keep a sterile dressing over top of it. You may gently  clean the wound with warm soap and water between dressing changes.  We strongly recommend that you have a medical provider reevaluate your wound within 2 to 3 days in person to make sure that it is healing appropriately.  Thank you for choosing an e-visit. Your e-visit answers were reviewed by a board certified advanced clinical practitioner to complete your personal care plan. Depending upon the condition, your plan could have included both over the counter or prescription medications. Please review your pharmacy choice. Make sure the pharmacy is open so you can pick up prescription now. If there is a problem, you may contact your provider through [employer] and have the prescription routed to another pharmacy. Your safety is important to [place]. If you have drug allergies check your prescription carefully.  For the next 24 hours you can use MyChart to ask questions about today's visit, request a non-urgent call back, or ask for a work or school excuse.  You will get an email with a link to our survey asking about your experience. I hope that your e-visit has been valuable and will speed your recovery  I hope you feel better soon,   Carmon Sails, [facility] [place]

## 2020-01-16 ENCOUNTER — Other Ambulatory Visit: Payer: 59

## 2020-01-17 MED FILL — FLUoxetine HCL 40 MG CAPS: 40 | 90 days supply | Qty: 90 | Fill #1

## 2020-01-22 MED FILL — LISINOPRIL 20 MG TABLET: 20 | 90 days supply | Qty: 90 | Fill #0

## 2020-01-23 ENCOUNTER — Other Ambulatory Visit: Payer: 59

## 2020-01-24 ENCOUNTER — Other Ambulatory Visit: Payer: 59

## 2020-01-24 DIAGNOSIS — Z20822 Contact with and (suspected) exposure to covid-19: Secondary | ICD-10-CM | POA: Diagnosis not present

## 2020-01-25 LAB — SARS-COV-2, NAA 2 DAY TAT

## 2020-01-25 LAB — NOVEL CORONAVIRUS, NAA: SARS-CoV-2, NAA: NOT DETECTED

## 2020-02-07 MED FILL — FENOFIBRATE 160 MG TABLET: 160 | 90 days supply | Qty: 90 | Fill #1

## 2020-02-11 ENCOUNTER — Ambulatory Visit
Admission: RE | Admit: 2020-02-11 | Discharge: 2020-02-11 | Disposition: A | Discharge status: Home / Self Care | Payer: 59 | Source: Ambulatory Visit

## 2020-02-11 ENCOUNTER — Other Ambulatory Visit: Payer: Self-pay

## 2020-02-11 DIAGNOSIS — Z1231 Encounter for screening mammogram for malignant neoplasm of breast: Secondary | ICD-10-CM

## 2020-04-30 ENCOUNTER — Other Ambulatory Visit (HOSPITAL_COMMUNITY): Payer: Self-pay | Admitting: Family Medicine

## 2020-04-30 MED FILL — FLUoxetine HCL 40 MG CAPS: 40 | 90 days supply | Qty: 90 | Fill #0

## 2020-04-30 MED FILL — LISINOPRIL 20 MG TABLET: 20 | 90 days supply | Qty: 90 | Fill #1

## 2020-06-17 ENCOUNTER — Other Ambulatory Visit (HOSPITAL_COMMUNITY): Payer: Self-pay

## 2020-06-18 ENCOUNTER — Other Ambulatory Visit (HOSPITAL_COMMUNITY): Payer: Self-pay

## 2020-06-18 MED ORDER — FENOFIBRATE 160 MG PO TABS
160.0000 mg | ORAL_TABLET | Freq: Every day | ORAL | 0 refills | Status: DC
  Filled 2020-06-18: qty 90, 90d supply, fill #0

## 2020-06-19 ENCOUNTER — Other Ambulatory Visit (HOSPITAL_COMMUNITY): Payer: Self-pay

## 2020-07-23 ENCOUNTER — Other Ambulatory Visit (HOSPITAL_COMMUNITY): Payer: Self-pay

## 2020-07-24 ENCOUNTER — Other Ambulatory Visit (HOSPITAL_COMMUNITY): Payer: Self-pay

## 2020-07-24 MED ORDER — LISINOPRIL 20 MG PO TABS
20.0000 mg | ORAL_TABLET | Freq: Every day | ORAL | 0 refills | Status: DC
  Filled 2020-07-24: qty 90, 90d supply, fill #0

## 2020-07-25 ENCOUNTER — Other Ambulatory Visit (HOSPITAL_COMMUNITY): Payer: Self-pay

## 2020-07-25 ENCOUNTER — Ambulatory Visit: Payer: 59

## 2020-08-25 ENCOUNTER — Other Ambulatory Visit (HOSPITAL_COMMUNITY): Payer: Self-pay

## 2020-08-25 DIAGNOSIS — E559 Vitamin D deficiency, unspecified: Secondary | ICD-10-CM | POA: Diagnosis not present

## 2020-08-25 DIAGNOSIS — E782 Mixed hyperlipidemia: Secondary | ICD-10-CM | POA: Diagnosis not present

## 2020-08-25 DIAGNOSIS — Z789 Other specified health status: Secondary | ICD-10-CM | POA: Diagnosis not present

## 2020-08-25 DIAGNOSIS — F339 Major depressive disorder, recurrent, unspecified: Secondary | ICD-10-CM | POA: Diagnosis not present

## 2020-08-25 DIAGNOSIS — R7303 Prediabetes: Secondary | ICD-10-CM | POA: Diagnosis not present

## 2020-08-25 DIAGNOSIS — L989 Disorder of the skin and subcutaneous tissue, unspecified: Secondary | ICD-10-CM | POA: Diagnosis not present

## 2020-08-25 DIAGNOSIS — I10 Essential (primary) hypertension: Secondary | ICD-10-CM | POA: Diagnosis not present

## 2020-08-25 DIAGNOSIS — Z1211 Encounter for screening for malignant neoplasm of colon: Secondary | ICD-10-CM | POA: Diagnosis not present

## 2020-08-25 DIAGNOSIS — Z0000 Encounter for general adult medical examination without abnormal findings: Secondary | ICD-10-CM | POA: Diagnosis not present

## 2020-08-25 MED ORDER — LISINOPRIL 20 MG PO TABS
20.0000 mg | ORAL_TABLET | Freq: Every day | ORAL | 3 refills | Status: DC
  Filled 2020-08-25 – 2020-10-22 (×2): qty 90, 90d supply, fill #0
  Filled 2021-01-19: qty 90, 90d supply, fill #1

## 2020-08-25 MED ORDER — FLUOXETINE HCL 40 MG PO CAPS
40.0000 mg | ORAL_CAPSULE | Freq: Every day | ORAL | 3 refills | Status: DC
  Filled 2020-08-25: qty 90, 90d supply, fill #0
  Filled 2021-01-19: qty 90, 90d supply, fill #1

## 2020-08-25 MED ORDER — FENOFIBRATE 160 MG PO TABS
160.0000 mg | ORAL_TABLET | Freq: Every day | ORAL | 3 refills | Status: DC
  Filled 2020-08-25 – 2020-09-26 (×3): qty 90, 90d supply, fill #0
  Filled 2021-01-19: qty 90, 90d supply, fill #1

## 2020-08-28 ENCOUNTER — Other Ambulatory Visit (HOSPITAL_COMMUNITY): Payer: Self-pay

## 2020-08-28 ENCOUNTER — Other Ambulatory Visit: Payer: Self-pay | Admitting: Family Medicine

## 2020-08-28 DIAGNOSIS — M8580 Other specified disorders of bone density and structure, unspecified site: Secondary | ICD-10-CM

## 2020-08-28 DIAGNOSIS — E348 Other specified endocrine disorders: Secondary | ICD-10-CM

## 2020-08-28 MED ORDER — ACCU-CHEK GUIDE W/DEVICE KIT
PACK | 0 refills | Status: AC
  Filled 2020-08-28: qty 1, 1d supply, fill #0

## 2020-08-28 MED ORDER — FREESTYLE LITE W/DEVICE KIT
PACK | 1 refills | Status: DC
  Filled 2020-08-28: qty 1, 1d supply, fill #0

## 2020-08-28 MED ORDER — FREESTYLE LANCETS MISC
3 refills | Status: DC
  Filled 2020-08-28: qty 100, 90d supply, fill #0

## 2020-08-28 MED ORDER — FREESTYLE LITE TEST VI STRP
ORAL_STRIP | 3 refills | Status: DC
  Filled 2020-08-28: qty 100, 90d supply, fill #0

## 2020-08-28 MED ORDER — GLUCOSE BLOOD VI STRP
ORAL_STRIP | 4 refills | Status: DC
  Filled 2020-08-28: qty 50, 50d supply, fill #0

## 2020-08-28 MED ORDER — ACCU-CHEK SOFTCLIX LANCETS MISC
4 refills | Status: AC
  Filled 2020-08-28: qty 100, 90d supply, fill #0

## 2020-09-01 ENCOUNTER — Other Ambulatory Visit (HOSPITAL_COMMUNITY): Payer: Self-pay

## 2020-09-10 ENCOUNTER — Other Ambulatory Visit (HOSPITAL_COMMUNITY): Payer: Self-pay

## 2020-09-17 DIAGNOSIS — Z1283 Encounter for screening for malignant neoplasm of skin: Secondary | ICD-10-CM | POA: Diagnosis not present

## 2020-09-17 DIAGNOSIS — D225 Melanocytic nevi of trunk: Secondary | ICD-10-CM | POA: Diagnosis not present

## 2020-09-17 DIAGNOSIS — L821 Other seborrheic keratosis: Secondary | ICD-10-CM | POA: Diagnosis not present

## 2020-09-17 DIAGNOSIS — D045 Carcinoma in situ of skin of trunk: Secondary | ICD-10-CM | POA: Diagnosis not present

## 2020-09-26 ENCOUNTER — Other Ambulatory Visit (HOSPITAL_COMMUNITY): Payer: Self-pay

## 2020-10-14 DIAGNOSIS — Z08 Encounter for follow-up examination after completed treatment for malignant neoplasm: Secondary | ICD-10-CM | POA: Diagnosis not present

## 2020-10-14 DIAGNOSIS — Z85828 Personal history of other malignant neoplasm of skin: Secondary | ICD-10-CM | POA: Diagnosis not present

## 2020-10-23 ENCOUNTER — Other Ambulatory Visit (HOSPITAL_COMMUNITY): Payer: Self-pay

## 2020-10-31 DIAGNOSIS — H40013 Open angle with borderline findings, low risk, bilateral: Secondary | ICD-10-CM | POA: Diagnosis not present

## 2020-10-31 DIAGNOSIS — H0102B Squamous blepharitis left eye, upper and lower eyelids: Secondary | ICD-10-CM | POA: Diagnosis not present

## 2020-10-31 DIAGNOSIS — H43813 Vitreous degeneration, bilateral: Secondary | ICD-10-CM | POA: Diagnosis not present

## 2020-10-31 DIAGNOSIS — H2513 Age-related nuclear cataract, bilateral: Secondary | ICD-10-CM | POA: Diagnosis not present

## 2020-10-31 DIAGNOSIS — H0102A Squamous blepharitis right eye, upper and lower eyelids: Secondary | ICD-10-CM | POA: Diagnosis not present

## 2021-01-19 ENCOUNTER — Other Ambulatory Visit (HOSPITAL_COMMUNITY): Payer: Self-pay

## 2021-03-05 ENCOUNTER — Ambulatory Visit
Admission: RE | Admit: 2021-03-05 | Discharge: 2021-03-05 | Disposition: A | Discharge status: Home / Self Care | Payer: Medicare HMO | Source: Ambulatory Visit | Attending: Family Medicine | Admitting: Family Medicine

## 2021-03-05 DIAGNOSIS — Z780 Asymptomatic menopausal state: Secondary | ICD-10-CM | POA: Diagnosis not present

## 2021-03-05 DIAGNOSIS — M8580 Other specified disorders of bone density and structure, unspecified site: Secondary | ICD-10-CM

## 2021-03-05 DIAGNOSIS — E348 Other specified endocrine disorders: Secondary | ICD-10-CM

## 2021-03-25 ENCOUNTER — Other Ambulatory Visit: Payer: Self-pay

## 2021-03-25 ENCOUNTER — Ambulatory Visit
Admission: RE | Admit: 2021-03-25 | Discharge: 2021-03-25 | Disposition: A | Discharge status: Home / Self Care | Payer: Medicare Other | Source: Ambulatory Visit | Attending: Family Medicine | Admitting: Family Medicine

## 2021-03-25 ENCOUNTER — Other Ambulatory Visit: Payer: Self-pay | Admitting: Family Medicine

## 2021-03-25 DIAGNOSIS — Z1231 Encounter for screening mammogram for malignant neoplasm of breast: Secondary | ICD-10-CM

## 2021-04-14 DIAGNOSIS — Z08 Encounter for follow-up examination after completed treatment for malignant neoplasm: Secondary | ICD-10-CM | POA: Diagnosis not present

## 2021-04-14 DIAGNOSIS — Z85828 Personal history of other malignant neoplasm of skin: Secondary | ICD-10-CM | POA: Diagnosis not present

## 2021-04-14 DIAGNOSIS — B351 Tinea unguium: Secondary | ICD-10-CM | POA: Diagnosis not present

## 2021-04-21 DIAGNOSIS — F339 Major depressive disorder, recurrent, unspecified: Secondary | ICD-10-CM | POA: Diagnosis not present

## 2021-04-21 DIAGNOSIS — R7303 Prediabetes: Secondary | ICD-10-CM | POA: Diagnosis not present

## 2021-04-21 DIAGNOSIS — R5383 Other fatigue: Secondary | ICD-10-CM | POA: Diagnosis not present

## 2021-04-21 DIAGNOSIS — E559 Vitamin D deficiency, unspecified: Secondary | ICD-10-CM | POA: Diagnosis not present

## 2021-05-14 ENCOUNTER — Other Ambulatory Visit (HOSPITAL_COMMUNITY): Payer: Self-pay

## 2021-05-21 ENCOUNTER — Other Ambulatory Visit (HOSPITAL_COMMUNITY): Payer: Self-pay

## 2021-06-16 DIAGNOSIS — Z6831 Body mass index (BMI) 31.0-31.9, adult: Secondary | ICD-10-CM | POA: Diagnosis not present

## 2021-06-16 DIAGNOSIS — H00015 Hordeolum externum left lower eyelid: Secondary | ICD-10-CM | POA: Diagnosis not present

## 2021-11-23 DIAGNOSIS — Z23 Encounter for immunization: Secondary | ICD-10-CM | POA: Diagnosis not present

## 2021-12-16 ENCOUNTER — Other Ambulatory Visit: Payer: Self-pay | Admitting: Family Medicine

## 2021-12-16 ENCOUNTER — Ambulatory Visit
Admission: RE | Admit: 2021-12-16 | Discharge: 2021-12-16 | Disposition: A | Discharge status: Home / Self Care | Payer: Medicare Other | Source: Ambulatory Visit | Attending: Family Medicine | Admitting: Family Medicine

## 2021-12-16 DIAGNOSIS — M25562 Pain in left knee: Secondary | ICD-10-CM | POA: Diagnosis not present

## 2021-12-27 DIAGNOSIS — B349 Viral infection, unspecified: Secondary | ICD-10-CM | POA: Diagnosis not present

## 2021-12-27 DIAGNOSIS — J0100 Acute maxillary sinusitis, unspecified: Secondary | ICD-10-CM | POA: Diagnosis not present

## 2022-01-04 ENCOUNTER — Other Ambulatory Visit (HOSPITAL_COMMUNITY): Payer: Self-pay

## 2022-01-04 DIAGNOSIS — M25562 Pain in left knee: Secondary | ICD-10-CM | POA: Diagnosis not present

## 2022-01-04 MED ORDER — MELOXICAM 15 MG PO TABS
15.0000 mg | ORAL_TABLET | Freq: Every day | ORAL | 1 refills | Status: DC | PRN
  Filled 2022-01-04: qty 60, 60d supply, fill #0

## 2022-01-05 DIAGNOSIS — M25562 Pain in left knee: Secondary | ICD-10-CM | POA: Diagnosis not present

## 2022-01-05 DIAGNOSIS — S76112D Strain of left quadriceps muscle, fascia and tendon, subsequent encounter: Secondary | ICD-10-CM | POA: Diagnosis not present

## 2022-01-06 DIAGNOSIS — H0102A Squamous blepharitis right eye, upper and lower eyelids: Secondary | ICD-10-CM | POA: Diagnosis not present

## 2022-01-06 DIAGNOSIS — H40013 Open angle with borderline findings, low risk, bilateral: Secondary | ICD-10-CM | POA: Diagnosis not present

## 2022-01-06 DIAGNOSIS — H2513 Age-related nuclear cataract, bilateral: Secondary | ICD-10-CM | POA: Diagnosis not present

## 2022-01-06 DIAGNOSIS — H43813 Vitreous degeneration, bilateral: Secondary | ICD-10-CM | POA: Diagnosis not present

## 2022-01-06 DIAGNOSIS — H0102B Squamous blepharitis left eye, upper and lower eyelids: Secondary | ICD-10-CM | POA: Diagnosis not present

## 2022-01-13 DIAGNOSIS — L821 Other seborrheic keratosis: Secondary | ICD-10-CM | POA: Diagnosis not present

## 2022-01-13 DIAGNOSIS — X32XXXD Exposure to sunlight, subsequent encounter: Secondary | ICD-10-CM | POA: Diagnosis not present

## 2022-01-13 DIAGNOSIS — L720 Epidermal cyst: Secondary | ICD-10-CM | POA: Diagnosis not present

## 2022-01-13 DIAGNOSIS — D225 Melanocytic nevi of trunk: Secondary | ICD-10-CM | POA: Diagnosis not present

## 2022-01-13 DIAGNOSIS — L570 Actinic keratosis: Secondary | ICD-10-CM | POA: Diagnosis not present

## 2022-01-14 DIAGNOSIS — M25562 Pain in left knee: Secondary | ICD-10-CM | POA: Diagnosis not present

## 2022-01-14 DIAGNOSIS — S76112D Strain of left quadriceps muscle, fascia and tendon, subsequent encounter: Secondary | ICD-10-CM | POA: Diagnosis not present

## 2022-01-25 DIAGNOSIS — Z23 Encounter for immunization: Secondary | ICD-10-CM | POA: Diagnosis not present

## 2022-02-17 DIAGNOSIS — M25562 Pain in left knee: Secondary | ICD-10-CM | POA: Diagnosis not present

## 2022-02-17 DIAGNOSIS — S76112D Strain of left quadriceps muscle, fascia and tendon, subsequent encounter: Secondary | ICD-10-CM | POA: Diagnosis not present

## 2022-02-18 DIAGNOSIS — H2511 Age-related nuclear cataract, right eye: Secondary | ICD-10-CM | POA: Diagnosis not present

## 2022-04-06 ENCOUNTER — Other Ambulatory Visit: Payer: Self-pay | Admitting: Family Medicine

## 2022-04-06 DIAGNOSIS — Z789 Other specified health status: Secondary | ICD-10-CM | POA: Diagnosis not present

## 2022-04-06 DIAGNOSIS — E782 Mixed hyperlipidemia: Secondary | ICD-10-CM | POA: Diagnosis not present

## 2022-04-06 DIAGNOSIS — M8580 Other specified disorders of bone density and structure, unspecified site: Secondary | ICD-10-CM | POA: Diagnosis not present

## 2022-04-06 DIAGNOSIS — F339 Major depressive disorder, recurrent, unspecified: Secondary | ICD-10-CM | POA: Diagnosis not present

## 2022-04-06 DIAGNOSIS — R7303 Prediabetes: Secondary | ICD-10-CM | POA: Diagnosis not present

## 2022-04-06 DIAGNOSIS — Z0000 Encounter for general adult medical examination without abnormal findings: Secondary | ICD-10-CM | POA: Diagnosis not present

## 2022-04-06 DIAGNOSIS — R350 Frequency of micturition: Secondary | ICD-10-CM | POA: Diagnosis not present

## 2022-04-06 DIAGNOSIS — Z6831 Body mass index (BMI) 31.0-31.9, adult: Secondary | ICD-10-CM | POA: Diagnosis not present

## 2022-04-06 DIAGNOSIS — E559 Vitamin D deficiency, unspecified: Secondary | ICD-10-CM | POA: Diagnosis not present

## 2022-04-06 DIAGNOSIS — B009 Herpesviral infection, unspecified: Secondary | ICD-10-CM | POA: Diagnosis not present

## 2022-04-06 DIAGNOSIS — Z1231 Encounter for screening mammogram for malignant neoplasm of breast: Secondary | ICD-10-CM

## 2022-04-06 DIAGNOSIS — I10 Essential (primary) hypertension: Secondary | ICD-10-CM | POA: Diagnosis not present

## 2022-04-16 ENCOUNTER — Ambulatory Visit
Admission: RE | Admit: 2022-04-16 | Discharge: 2022-04-16 | Disposition: A | Discharge status: Home / Self Care | Payer: Medicare Other | Source: Ambulatory Visit | Attending: Family Medicine | Admitting: Family Medicine

## 2022-04-16 DIAGNOSIS — Z1231 Encounter for screening mammogram for malignant neoplasm of breast: Secondary | ICD-10-CM

## 2022-05-03 DIAGNOSIS — I10 Essential (primary) hypertension: Secondary | ICD-10-CM | POA: Diagnosis not present

## 2022-05-03 DIAGNOSIS — E782 Mixed hyperlipidemia: Secondary | ICD-10-CM | POA: Diagnosis not present

## 2022-05-03 DIAGNOSIS — R058 Other specified cough: Secondary | ICD-10-CM | POA: Diagnosis not present

## 2022-05-03 DIAGNOSIS — R7309 Other abnormal glucose: Secondary | ICD-10-CM | POA: Diagnosis not present

## 2022-05-03 DIAGNOSIS — R5383 Other fatigue: Secondary | ICD-10-CM | POA: Diagnosis not present

## 2022-05-03 DIAGNOSIS — E559 Vitamin D deficiency, unspecified: Secondary | ICD-10-CM | POA: Diagnosis not present

## 2022-05-03 DIAGNOSIS — R0602 Shortness of breath: Secondary | ICD-10-CM | POA: Diagnosis not present

## 2022-05-03 DIAGNOSIS — R400 Somnolence: Secondary | ICD-10-CM | POA: Diagnosis not present

## 2022-05-03 DIAGNOSIS — Z6832 Body mass index (BMI) 32.0-32.9, adult: Secondary | ICD-10-CM | POA: Diagnosis not present

## 2022-05-03 DIAGNOSIS — R12 Heartburn: Secondary | ICD-10-CM | POA: Diagnosis not present

## 2022-05-25 ENCOUNTER — Ambulatory Visit: Payer: Medicare Other

## 2022-07-06 DIAGNOSIS — M25562 Pain in left knee: Secondary | ICD-10-CM | POA: Diagnosis not present

## 2022-07-06 DIAGNOSIS — R0989 Other specified symptoms and signs involving the circulatory and respiratory systems: Secondary | ICD-10-CM | POA: Diagnosis not present

## 2022-07-06 DIAGNOSIS — Z6831 Body mass index (BMI) 31.0-31.9, adult: Secondary | ICD-10-CM | POA: Diagnosis not present

## 2022-07-09 DIAGNOSIS — M25552 Pain in left hip: Secondary | ICD-10-CM | POA: Diagnosis not present

## 2022-07-09 DIAGNOSIS — M545 Low back pain, unspecified: Secondary | ICD-10-CM | POA: Diagnosis not present

## 2022-07-09 IMAGING — MG DIGITAL SCREENING BILAT W/ TOMO W/ CAD
8 series · 8 of 24 positions shown · non-contrast
Comparison: Previous exam(s).

CLINICAL DATA: Screening.

EXAM:
DIGITAL SCREENING BILATERAL MAMMOGRAM WITH TOMO AND CAD

[L MLO synth-2D]
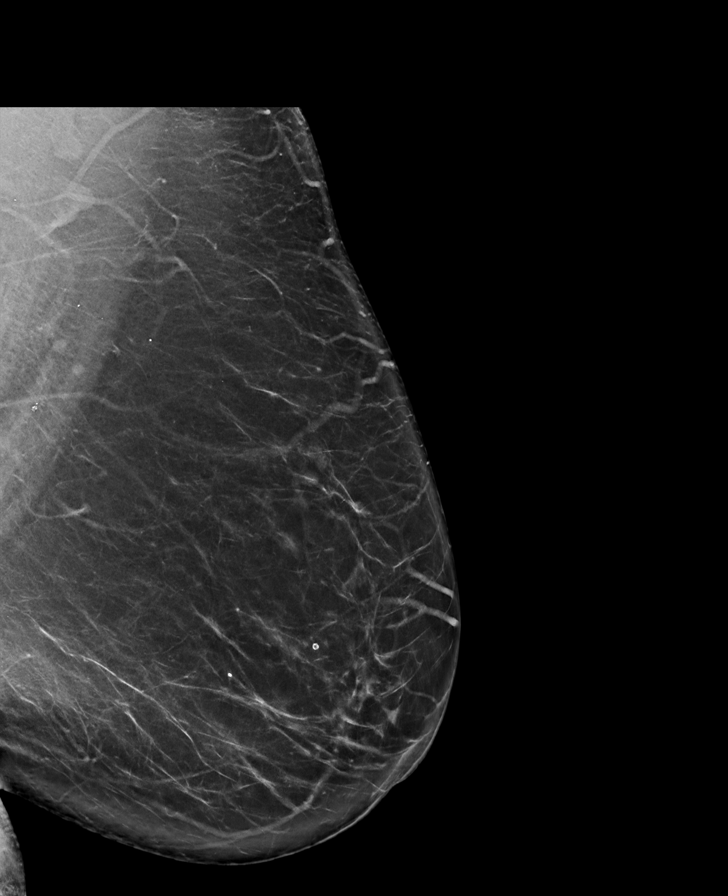

[L CC synth-2D]
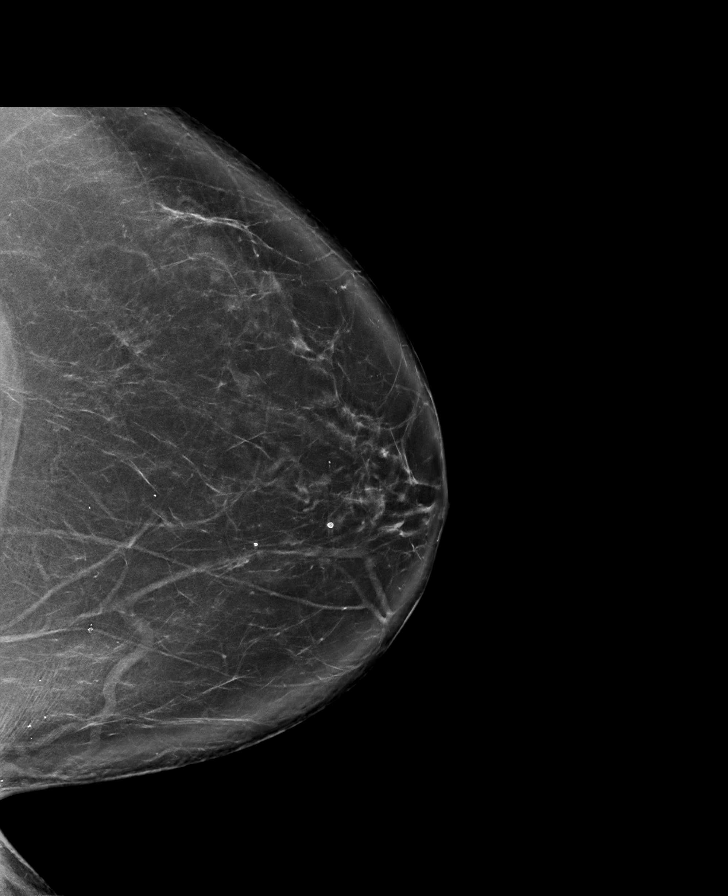

[R CC synth-2D]
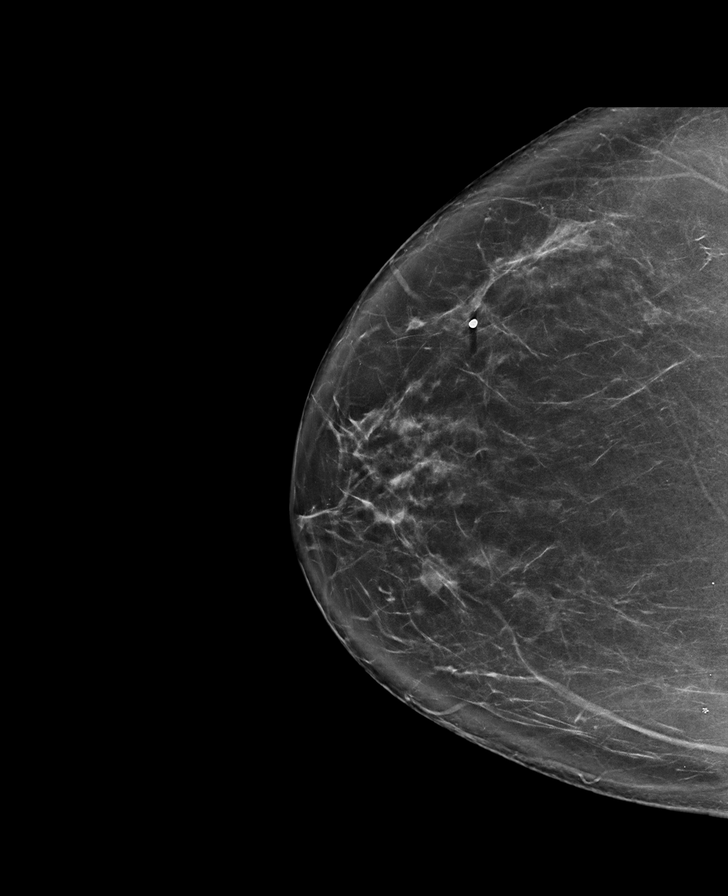

[R MLO synth-2D]
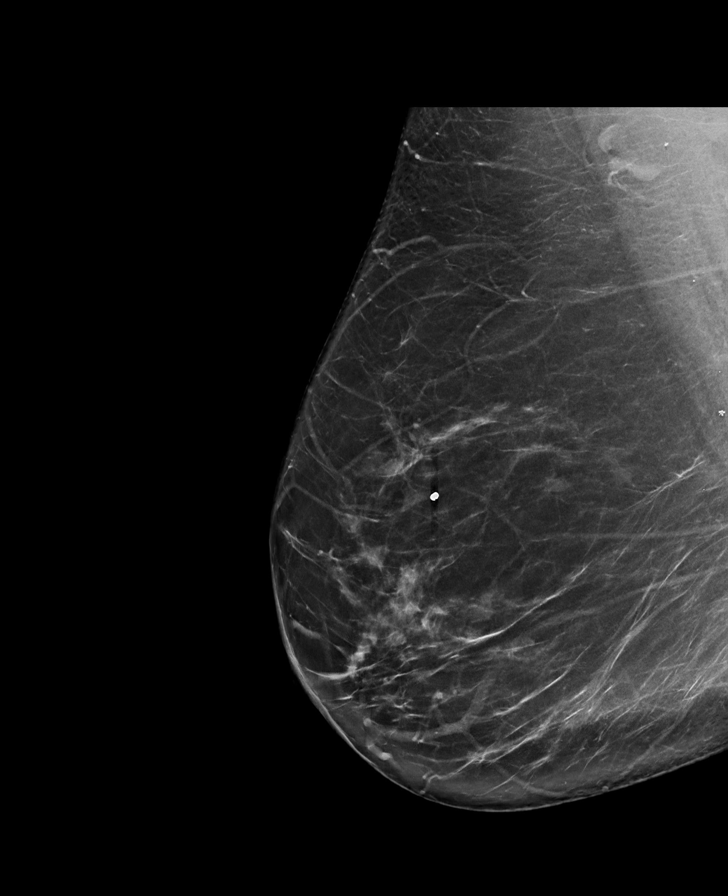

[R CC tomo · tomo slice 43/86.0]
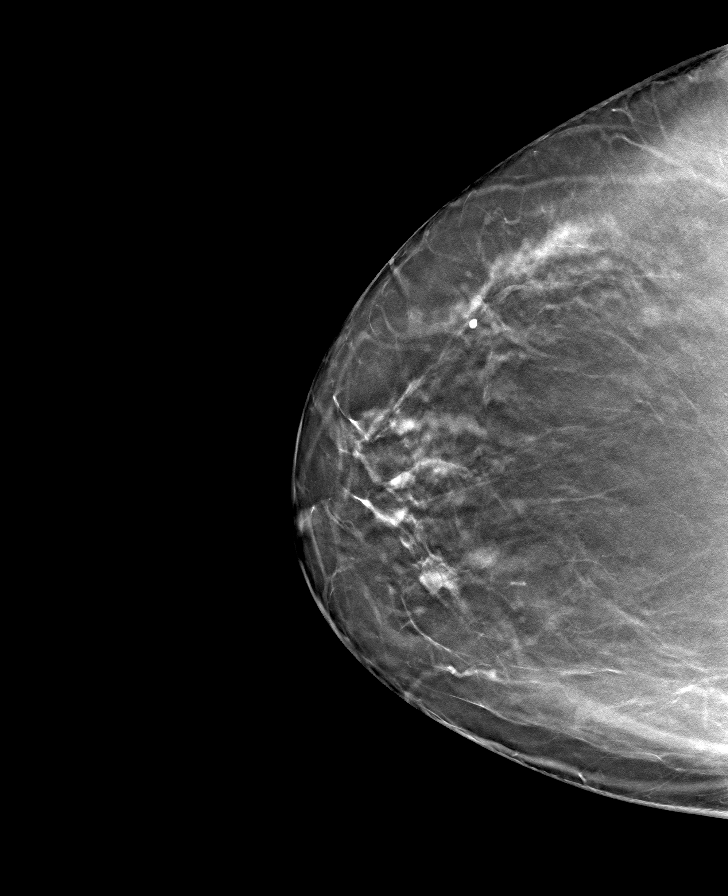

[L CC tomo · tomo slice 47/92.0]
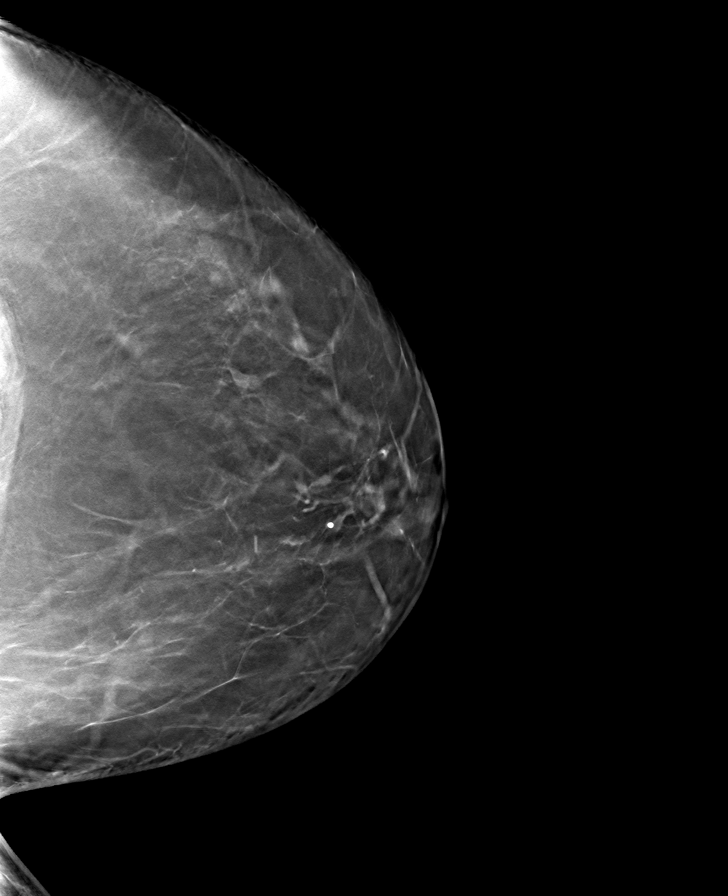

[L MLO tomo · tomo slice 47/93.0]
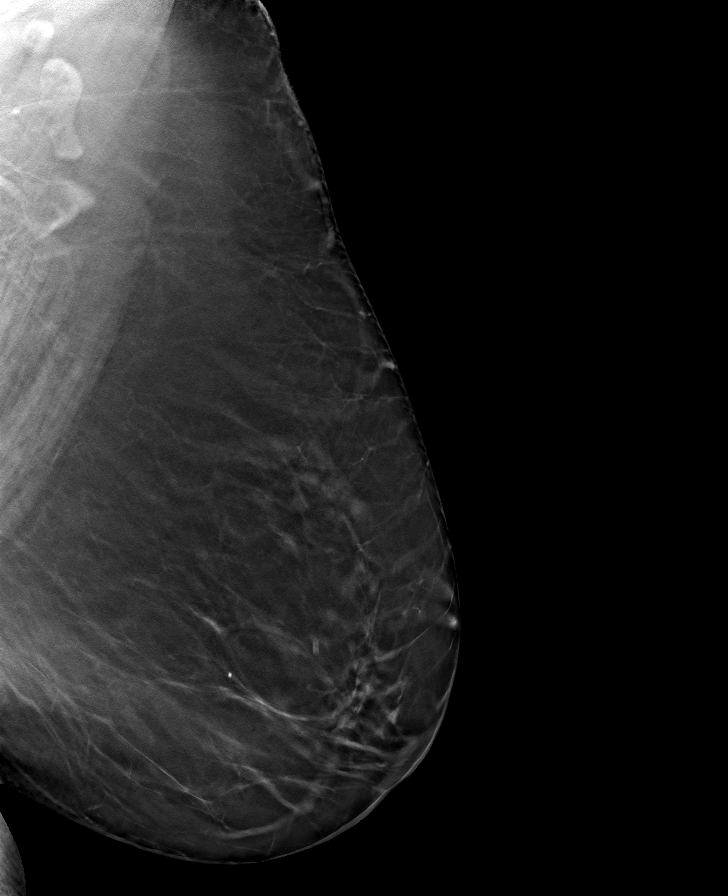

[R MLO tomo · tomo slice 47/92.0]
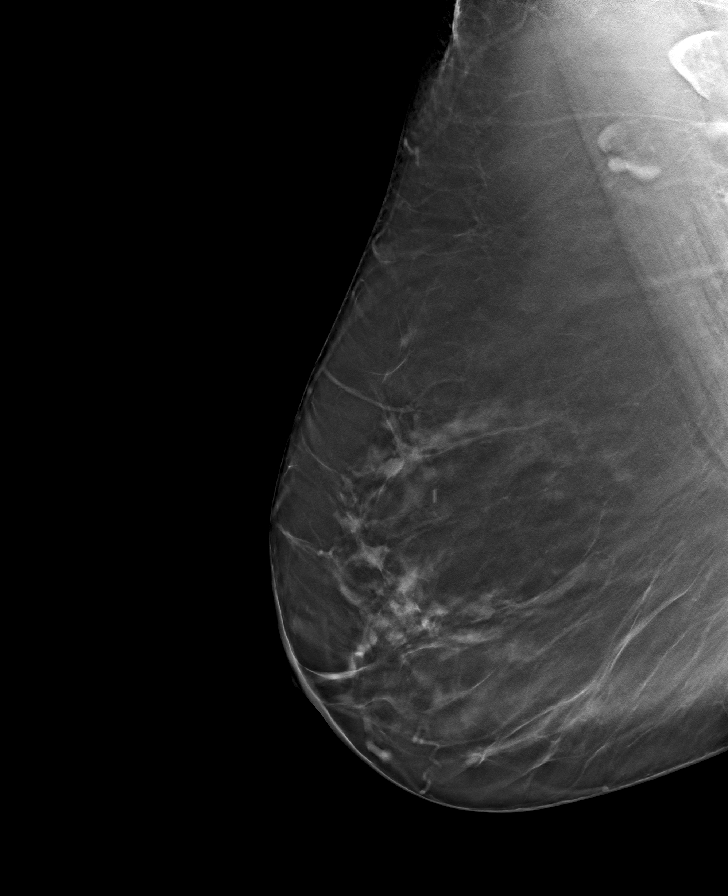

[8 of 24 positions shown; findings below may reference images not displayed]

ACR Breast Density Category b: There are scattered areas of
fibroglandular density.
FINDINGS: There are no findings suspicious for malignancy. Images were
processed with CAD.
IMPRESSION: No mammographic evidence of malignancy. A result letter of this
screening mammogram will be mailed directly to the patient.

RECOMMENDATION:
Screening mammogram in one year. (Code:[id number])

BI-RADS CATEGORY  1: Negative.

## 2022-07-14 NOTE — Progress Notes (Signed)
CARDIOLOGY CONSULT NOTE       Patient ID: Lori Walsh MRN: [id number] DOB/AGE: [birth year] 67 y.o.  Referring Physician: Selena Batten Primary Physician: Jarrett Soho, PA-C Primary Cardiologist: New Reason for Consultation: Exertional dyspnea  Active Problems:   * No active hospital problems. *   HPI:  67 y.o. referred by Dr Uvaldo Rising for exertional dyspnea. History of depression GERD, HLD HTN and pre diabetes Office visit with primary on 05/03/22 reviewed. Multiple somatic complaints. Does not sleep well and tired. Exertional dyspnea. Cold hands when she lays down at night Family stressors going on Worse indigestion resolved with tums  Gives her a sensation of burning chest pain History of GERD worse with weight gain.  Sedentary and dyspnea improves when resting She is able to hike and others note her being dyspneic   Lab review LDL 100 A1c 6 normal lytes/lft's Hct 38   Seen by me for same in 2017  Had myovue in 2008 with breast attenuation deferred cath. 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   TTE 09/04/15 normal EF 60-65% grade one diastolic Normal valves  Calcium score 09/05/15 was 0   Retired from [employer] and husband is a Geophysicist/field seismologist by education  ROS All other systems reviewed and negative except as noted above  Past Medical History:  Diagnosis Date   Depression    Fibroadenoma of right breast    GERD (gastroesophageal reflux disease)    History of gestational diabetes    History of nuclear stress test    04-16-2014  normal w/ no ischemia , ef 82% (in epic)   Hyperlipidemia    Hypertension    Left wrist fracture    Pre-diabetes    Thyroid nodule    bilateral --- previous bx , benign/   last ultrasound in epic 08-19-2017   TMJ syndrome     Family History  Problem Relation Age of Onset    Aneurysm Mother    Hypertension Mother    Parkinson's disease Father    Hypertension Father    Hypertension Sister    Alcoholism Sister    Healthy Daughter    Cancer Maternal Grandmother        pancreatic   Heart attack Maternal Grandfather    Pneumonia Paternal Grandmother    Esophageal cancer Paternal Grandfather    HIV Son    Breast cancer Neg Hx     Social History   Socioeconomic History   Marital status: Married    Spouse name: Not on file   Number of children: Not on file   Years of education: Not on file   Highest education level: Not on file  Occupational History   Not on file  Tobacco Use   Smoking status: Never   Smokeless tobacco: Never  Vaping Use   Vaping Use: Never used  Substance and Sexual Activity   Alcohol use: Not Currently    Comment: occasional   Drug use: Never   Sexual activity: Not Currently    Birth control/protection: Post-menopausal  Other Topics Concern   Not on file  Social History Narrative   Not on file   Social Determinants of Health   Financial Resource Strain: Not on file  Food Insecurity: Not on file  Transportation Needs: Not on file  Physical Activity: Not on file  Stress: Not on file  Social Connections: Not on file  Intimate Partner Violence: Not on file    Past Surgical History:  Procedure Laterality Date   BREAST EXCISIONAL BIOPSY Right 1995   benign   COLONOSCOPY  last one 2007   COLONOSCOPY WITH PROPOFOL N/A 01/22/2019   Procedure: COLONOSCOPY WITH PROPOFOL;  Surgeon: Charlott Rakes, MD;  Location: [facility];  Service: Endoscopy;  Laterality: N/A;   MICROTUBOPLASTY  1981   ORIF WRIST FRACTURE Left 10/10/2018   Procedure: OPEN REDUCTION INTERNAL FIXATION (ORIF) WRIST FRACTURE;  Surgeon: Sheral Apley, MD;  Location: [facility];  Service: Orthopedics;  Laterality: Left;   TUBAL LIGATION Bilateral 1987      Current Outpatient Medications:    Accu-Chek Softclix Lancets lancets, Use as  directed once daily, Disp: 100 each, Rfl: 4   aspirin EC 81 MG tablet, Take 81 mg by mouth at bedtime. , Disp: , Rfl:    Blood Glucose Monitoring Suppl (ACCU-CHEK GUIDE) w/Device KIT, Use as directed to check glucose once daily, Disp: 1 kit, Rfl: 0   calcium carbonate (TUMS - DOSED IN MG ELEMENTAL CALCIUM) 500 MG chewable tablet, Chew 1 tablet by mouth as needed for indigestion or heartburn., Disp: , Rfl:    cholecalciferol (VITAMIN D) 1000 UNITS tablet, Take 1,000 Units by mouth daily., Disp: , Rfl:    estradiol (ESTRACE VAGINAL) 0.1 MG/GM vaginal cream, Place 1 Applicatorful vaginally 2 (two) times a week., Disp: 42.5 g, Rfl: 1   fenofibrate 160 MG tablet, Take 1 tablet (160 mg total) by mouth daily., Disp: 90 tablet, Rfl: 3   FLUoxetine (PROZAC) 40 MG capsule, Take 1 capsule (40 mg total) by mouth daily., Disp: 90 capsule, Rfl: 3   fluticasone (FLONASE) 50 MCG/ACT nasal spray, Place 2 sprays into both nostrils 2 (two) times daily. (Patient taking differently: Place 2 sprays into both nostrils 2 (two) times daily as needed for allergies. ), Disp: 15.8 g, Rfl: 0   glucosamine-chondroitin 500-400 MG tablet, Take 1 tablet by mouth daily. , Disp: , Rfl:    glucose blood test strip, Use as directed once daily, Disp: 100 each, Rfl: 4   Lancets (FREESTYLE) lancets, , Disp: , Rfl:    lisinopril (ZESTRIL) 20 MG tablet, Take 1 tablet (20 mg total) by mouth daily., Disp: 90 tablet, Rfl: 3   meloxicam (MOBIC) 15 MG tablet, Take 1 tablet (15 mg total) by mouth daily as needed for pain and inflammation, Disp: 60 tablet, Rfl: 1   Multiple Vitamin (MULTIVITAMIN) tablet, Take 1 tablet by mouth daily., Disp: , Rfl:    nitrofurantoin, macrocrystal-monohydrate, (MACROBID) 100 MG capsule, Take 1 capsule (100 mg total) by mouth 2 (two) times daily., Disp: 10 capsule, Rfl: 0   trolamine salicylate (ASPERCREME) 10 % cream, Apply 1 application topically as needed for muscle pain (hip)., Disp: , Rfl:     Physical  Exam: Last menstrual period 03/08/2009.    Affect appropriate Healthy:  appears stated age HEENT: normal Neck supple with no adenopathy JVP normal no bruits no thyromegaly Lungs clear with no wheezing and good diaphragmatic motion Heart:  S1/S2 no murmur, no rub, gallop or click PMI normal Abdomen: benighn, BS positve, no tenderness, no AAA no bruit.  No HSM or HJR Distal pulses intact with no bruits No edema Neuro non-focal Skin warm and dry No muscular weakness   Labs:   Lab Results  Component Value Date   HGB 12.9 10/10/2018   HCT 38.0 10/10/2018  No results for input(s): "NA", "K", "CL", "CO2", "BUN", "CREATININE", "CALCIUM", "PROT", "BILITOT", "ALKPHOS", "ALT", "AST", "GLUCOSE" in the last 168 hours.  Invalid input(s): "LABALBU" No results found for: "CKTOTAL", "CKMB", "CKMBINDEX", "TROPONINI" No results found for: "CHOL" No results found for: "HDL" No results found for: "LDLCALC" No results found for: "TRIG" No results found for: "CHOLHDL" No results found for: "LDLDIRECT"    Radiology: No results found.  EKG: SR LAE otherwise normal    ASSESSMENT AND PLAN:   Dyspnea:  no high risk features Labs ok need to check BNP and TSH.  Update TTE    HTN:  controlled continue ACE Anxiety/Depression:  continue Prozac 4.  GERD:  weight loss PRN tums consider proton pump inhibiter per priamry  TTE BNP/TSH Calcium score   F/U PRN pending studies   Signed: Charlton Haws 07/21/2022, 11:16 AM

## 2022-07-19 DIAGNOSIS — M545 Low back pain, unspecified: Secondary | ICD-10-CM | POA: Diagnosis not present

## 2022-07-19 DIAGNOSIS — M5416 Radiculopathy, lumbar region: Secondary | ICD-10-CM | POA: Diagnosis not present

## 2022-07-21 ENCOUNTER — Ambulatory Visit: Payer: Medicare Other | Attending: Cardiovascular Disease | Admitting: Cardiovascular Disease

## 2022-07-21 ENCOUNTER — Encounter: Payer: Self-pay | Admitting: Cardiovascular Disease

## 2022-07-21 VITALS — BP 130/84 | HR 78 | Ht 62.75 in | Wt 177.0 lb

## 2022-07-21 DIAGNOSIS — R0609 Other forms of dyspnea: Secondary | ICD-10-CM | POA: Diagnosis not present

## 2022-07-21 DIAGNOSIS — I10 Essential (primary) hypertension: Secondary | ICD-10-CM | POA: Insufficient documentation

## 2022-07-21 DIAGNOSIS — K2100 Gastro-esophageal reflux disease with esophagitis, without bleeding: Secondary | ICD-10-CM | POA: Insufficient documentation

## 2022-07-21 DIAGNOSIS — Z8249 Family history of ischemic heart disease and other diseases of the circulatory system: Secondary | ICD-10-CM | POA: Insufficient documentation

## 2022-07-21 NOTE — Patient Instructions (Signed)
Medication Instructions:  Your physician recommends that you continue on your current medications as directed. Please refer to the Current Medication list given to you today.  *If you need a refill on your cardiac medications before your next appointment, please call your pharmacy*  Lab Work: TODAY: BNP, TSH If you have labs (blood work) drawn today and your tests are completely normal, you will receive your results only by: MyChart Message (if you have MyChart) OR A paper copy in the mail If you have any lab test that is abnormal or we need to change your treatment, we will call you to review the results.  Testing/Procedures: Your physician has requested that you have an echocardiogram. Echocardiography is a painless test that uses sound waves to create images of your heart. It provides your doctor with information about the size and shape of your heart and how well your heart's chambers and valves are working. This procedure takes approximately one hour. There are no restrictions for this procedure. Please do NOT wear cologne, perfume, aftershave, or lotions (deodorant is allowed). Please arrive 15 minutes prior to your appointment time.   Your physician has requested that you have a coronary calcium score performed. This is not covered by insurance and will be an out-of-pocket cost of approximately $99.   Follow-Up: As needed pending results of echocardiogram and calcium score.

## 2022-07-22 LAB — TSH: TSH: 0.235 u[IU]/mL — ABNORMAL LOW (ref 0.450–4.500)

## 2022-07-22 LAB — PRO B NATRIURETIC PEPTIDE: NT-Pro BNP: 64 pg/mL (ref 0–301)

## 2022-07-29 DIAGNOSIS — M5416 Radiculopathy, lumbar region: Secondary | ICD-10-CM | POA: Diagnosis not present

## 2022-07-29 DIAGNOSIS — M545 Low back pain, unspecified: Secondary | ICD-10-CM | POA: Diagnosis not present

## 2022-07-30 ENCOUNTER — Other Ambulatory Visit: Payer: Self-pay

## 2022-07-30 ENCOUNTER — Emergency Department (HOSPITAL_BASED_OUTPATIENT_CLINIC_OR_DEPARTMENT_OTHER)
Admission: EM | Admit: 2022-07-30 | Discharge: 2022-07-31 | Disposition: A | Discharge status: Home / Self Care | Payer: Medicare Other | Attending: Emergency Medicine | Admitting: Emergency Medicine

## 2022-07-30 ENCOUNTER — Encounter (HOSPITAL_BASED_OUTPATIENT_CLINIC_OR_DEPARTMENT_OTHER): Payer: Self-pay

## 2022-07-30 DIAGNOSIS — Z7982 Long term (current) use of aspirin: Secondary | ICD-10-CM | POA: Diagnosis not present

## 2022-07-30 DIAGNOSIS — R739 Hyperglycemia, unspecified: Secondary | ICD-10-CM | POA: Diagnosis not present

## 2022-07-30 DIAGNOSIS — R252 Cramp and spasm: Secondary | ICD-10-CM | POA: Insufficient documentation

## 2022-07-30 DIAGNOSIS — E1165 Type 2 diabetes mellitus with hyperglycemia: Secondary | ICD-10-CM | POA: Diagnosis not present

## 2022-07-30 LAB — CBC WITH DIFFERENTIAL/PLATELET
Abs Immature Granulocytes: 0.09 10*3/uL — ABNORMAL HIGH (ref 0.00–0.07)
Basophils Absolute: 0 10*3/uL (ref 0.0–0.1)
Basophils Relative: 0 %
Eosinophils Absolute: 0 10*3/uL (ref 0.0–0.5)
Eosinophils Relative: 0 %
HCT: 40 % (ref 36.0–46.0)
Hemoglobin: 13.1 g/dL (ref 12.0–15.0)
Immature Granulocytes: 1 %
Lymphocytes Relative: 7 %
Lymphs Abs: 0.7 10*3/uL (ref 0.7–4.0)
MCH: 29.7 pg (ref 26.0–34.0)
MCHC: 32.8 g/dL (ref 30.0–36.0)
MCV: 90.7 fL (ref 80.0–100.0)
Monocytes Absolute: 0.1 10*3/uL (ref 0.1–1.0)
Monocytes Relative: 1 %
Neutro Abs: 10.1 10*3/uL — ABNORMAL HIGH (ref 1.7–7.7)
Neutrophils Relative %: 91 %
Platelets: 264 10*3/uL (ref 150–400)
RBC: 4.41 MIL/uL (ref 3.87–5.11)
RDW: 13.5 % (ref 11.5–15.5)
WBC: 11 10*3/uL — ABNORMAL HIGH (ref 4.0–10.5)
nRBC: 0 % (ref 0.0–0.2)

## 2022-07-30 LAB — CBG MONITORING, ED: Glucose-Capillary: 283 mg/dL — ABNORMAL HIGH (ref 70–99)

## 2022-07-30 MED ORDER — SODIUM CHLORIDE 0.9 % IV BOLUS
1000.0000 mL | Freq: Once | INTRAVENOUS | Status: AC
  Administered 2022-07-30: 1000 mL via INTRAVENOUS

## 2022-07-30 NOTE — ED Triage Notes (Signed)
Pt is pre-diabetic so has been checking CBG at home and it was 300 mg/dL.  Pt is on Prednisone at this time and took 6 pills today and it is the beginning of the 3rd round.  Pt is not on Metformin nor on Insulin.

## 2022-07-31 LAB — BASIC METABOLIC PANEL
Anion gap: 7 (ref 5–15)
BUN: 22 mg/dL (ref 8–23)
CO2: 26 mmol/L (ref 22–32)
Calcium: 8.8 mg/dL — ABNORMAL LOW (ref 8.9–10.3)
Chloride: 101 mmol/L (ref 98–111)
Creatinine, Ser: 0.94 mg/dL (ref 0.44–1.00)
GFR, Estimated: >60 mL/min (ref >60)
Glucose, Bld: 265 mg/dL — ABNORMAL HIGH (ref 70–99)
Potassium: 4.3 mmol/L (ref 3.5–5.1)
Sodium: 134 mmol/L — ABNORMAL LOW (ref 135–145)

## 2022-07-31 MED ORDER — DICLOFENAC SODIUM 1 % EX GEL: 4.0000 g | Freq: Four times a day (QID) | CUTANEOUS | 0 refills | Status: DC

## 2022-07-31 NOTE — ED Provider Notes (Signed)
[facility] Provider Note   CSN: [id number] Arrival date & time: 07/30/22  2246     History  Chief Complaint  Patient presents with   Hyperglycemia    Lori Walsh is a 67 y.o. female.  67 yo F with a chief complaints of muscle cramps.  This has been noticed over the past couple days.  She had had increased thirst and increased urination as well.  She unfortunately is been struggling with sciatica and has been on repeated doses of prednisone.  Started a 6-day course yesterday.  She checked her blood sugar and realized that it was 300.  She had up coming to the ED for evaluation.  She denies cough congestion or fever.  Nausea vomiting or diarrhea.   Hyperglycemia      Home Medications Prior to Admission medications   Medication Sig Start Date End Date Taking? Authorizing Provider  diclofenac Sodium (VOLTAREN) 1 % GEL Apply 4 g topically 4 (four) times daily. 07/31/22  Yes Melene Plan, DO  Accu-Chek Softclix Lancets lancets Use as directed once daily 08/28/20     aspirin EC 81 MG tablet Take 81 mg by mouth at bedtime.     [provider]  Blood Glucose Monitoring Suppl (ACCU-CHEK GUIDE) w/Device KIT Use as directed to check glucose once daily 08/28/20     calcium carbonate (TUMS - DOSED IN MG ELEMENTAL CALCIUM) 500 MG chewable tablet Chew 1 tablet by mouth as needed for indigestion or heartburn.    [provider]  CELEBREX 100 MG capsule Take 100 mg by mouth 2 (two) times daily. 07/19/22   [provider]  cholecalciferol (VITAMIN D) 1000 UNITS tablet Take 1,000 Units by mouth daily.    [provider]  estradiol (ESTRACE VAGINAL) 0.1 MG/GM vaginal cream Place 1 Applicatorful vaginally 2 (two) times a week. 05/03/19   Jerene Bears, MD  fenofibrate 160 MG tablet Take 1 tablet (160 mg total) by mouth daily. 08/25/20     fluticasone (FLONASE) 50 MCG/ACT nasal spray Place 2 sprays into both nostrils 2 (two)  times daily. Patient taking differently: Place 2 sprays into both nostrils 2 (two) times daily as needed for allergies. 01/25/17   Dorena Bodo, NP  glucosamine-chondroitin 500-400 MG tablet Take 1 tablet by mouth daily.     [provider]  glucose blood test strip Use as directed once daily 08/28/20     Lancets (FREESTYLE) lancets  04/04/19   [provider]  lansoprazole (PREVACID) 15 MG capsule Take 15 mg by mouth daily at 12 noon.    [provider]  lisinopril (ZESTRIL) 20 MG tablet Take 1 tablet (20 mg total) by mouth daily. 08/25/20     MEDROL 4 MG tablet Take 4 mg by mouth. 6 day dose pack 07/19/22   [provider]  meloxicam (MOBIC) 15 MG tablet Take 1 tablet (15 mg total) by mouth daily as needed for pain and inflammation 01/04/22     Multiple Vitamin (MULTIVITAMIN) tablet Take 1 tablet by mouth daily.    [provider]  trolamine salicylate (ASPERCREME) 10 % cream Apply 1 application topically as needed for muscle pain (hip).    [provider]      Allergies    Strawberry extract, Crestor [rosuvastatin], Lipitor [atorvastatin], and Zocor [simvastatin]    Review of Systems   Review of Systems  Physical Exam Updated Vital Signs BP (!) 148/80   Pulse 73  Temp 98.3 F (36.8 C) (Oral)   Resp 18   Ht 5' 2.75" (1.594 m)   Wt 80.3 kg   LMP 03/08/2009 (Approximate)   SpO2 98%   BMI 31.60 kg/m  Physical Exam Vitals and nursing note reviewed.  Constitutional:      General: She is not in acute distress.    Appearance: She is well-developed. She is not diaphoretic.  HENT:     Head: Normocephalic and atraumatic.  Eyes:     Pupils: Pupils are equal, round, and reactive to light.  Cardiovascular:     Rate and Rhythm: Normal rate and regular rhythm.     Heart sounds: No murmur heard.    No friction rub. No gallop.  Pulmonary:     Effort: Pulmonary effort is normal.     Breath sounds: No wheezing or rales.   Abdominal:     General: There is no distension.     Palpations: Abdomen is soft.     Tenderness: There is no abdominal tenderness.  Musculoskeletal:        General: No tenderness.     Cervical back: Normal range of motion and neck supple.  Skin:    General: Skin is warm and dry.  Neurological:     Mental Status: She is alert and oriented to person, place, and time.  Psychiatric:        Behavior: Behavior normal.     ED Results / Procedures / Treatments   Labs (all labs ordered are listed, but only abnormal results are displayed) Labs Reviewed  CBC WITH DIFFERENTIAL/PLATELET - Abnormal; Notable for the following components:      Result Value   WBC 11.0 (*)    Neutro Abs 10.1 (*)    Abs Immature Granulocytes 0.09 (*)    All other components within normal limits  BASIC METABOLIC PANEL - Abnormal; Notable for the following components:   Sodium 134 (*)    Glucose, Bld 265 (*)    Calcium 8.8 (*)    All other components within normal limits  CBG MONITORING, ED - Abnormal; Notable for the following components:   Glucose-Capillary 283 (*)    All other components within normal limits    EKG None  Radiology No results found.  Procedures Procedures    Medications Ordered in ED Medications  sodium chloride 0.9 % bolus 1,000 mL (1,000 mLs Intravenous New Bag/Given 07/30/22 2348)    ED Course/ Medical Decision Making/ A&P                             Medical Decision Making Amount and/or Complexity of Data Reviewed Labs: ordered.   67 yo F with a chief complaint of hyperglycemia.  This is most likely caused by her steroid use.  Will have her stop taking her prednisone.  Will have her follow-up with her family doctor in the office.  She had blood work here without significant electrolyte abnormality.  Mild hyperglycemia without acidosis or anion gap.  Trivial leukocytosis could be due to demargination.  12:14 AM:  I have discussed the diagnosis/risks/treatment options  with the patient and family.  Evaluation and diagnostic testing in the emergency department does not suggest an emergent condition requiring admission or immediate intervention beyond what has been performed at this time.  They will follow up with PCP. We also discussed returning to the ED immediately if new or worsening sx occur. We discussed the sx which are  most concerning (e.g., sudden worsening pain, fever, inability to tolerate by mouth) that necessitate immediate return. Medications administered to the patient during their visit and any new prescriptions provided to the patient are listed below.  Medications given during this visit Medications  sodium chloride 0.9 % bolus 1,000 mL (1,000 mLs Intravenous New Bag/Given 07/30/22 2348)     The patient appears reasonably screen and/or stabilized for discharge and I doubt any other medical condition or other [facility] requiring further screening, evaluation, or treatment in the ED at this time prior to discharge.          Final Clinical Impression(s) / ED Diagnoses Final diagnoses:  Hyperglycemia    Rx / DC Orders ED Discharge Orders          Ordered    diclofenac Sodium (VOLTAREN) 1 % GEL  4 times daily        07/31/22 0013              Melene Plan, DO 07/31/22 0015

## 2022-07-31 NOTE — Discharge Instructions (Addendum)
Stop taking your prednisone.  Please follow-up with your family doctor in the office.  Please return for worsening fatigue persistent high blood sugars despite stopping the prednisone.

## 2022-07-31 NOTE — ED Notes (Signed)
MD at bedside. 

## 2022-07-31 NOTE — ED Notes (Signed)
Reviewed AVS with patient, patient expressed understanding of directions, denies further questions at this time. 

## 2022-08-04 DIAGNOSIS — M545 Low back pain, unspecified: Secondary | ICD-10-CM | POA: Diagnosis not present

## 2022-08-04 DIAGNOSIS — M5416 Radiculopathy, lumbar region: Secondary | ICD-10-CM | POA: Diagnosis not present

## 2022-08-09 DIAGNOSIS — R7303 Prediabetes: Secondary | ICD-10-CM | POA: Diagnosis not present

## 2022-08-09 DIAGNOSIS — M5442 Lumbago with sciatica, left side: Secondary | ICD-10-CM | POA: Diagnosis not present

## 2022-08-09 DIAGNOSIS — M5416 Radiculopathy, lumbar region: Secondary | ICD-10-CM | POA: Diagnosis not present

## 2022-08-09 DIAGNOSIS — R12 Heartburn: Secondary | ICD-10-CM | POA: Diagnosis not present

## 2022-08-09 DIAGNOSIS — M545 Low back pain, unspecified: Secondary | ICD-10-CM | POA: Diagnosis not present

## 2022-08-09 DIAGNOSIS — R0602 Shortness of breath: Secondary | ICD-10-CM | POA: Diagnosis not present

## 2022-08-10 ENCOUNTER — Ambulatory Visit (HOSPITAL_BASED_OUTPATIENT_CLINIC_OR_DEPARTMENT_OTHER)
Admission: RE | Admit: 2022-08-10 | Discharge: 2022-08-10 | Disposition: A | Discharge status: Home / Self Care | Payer: Medicare Other | Source: Ambulatory Visit | Attending: Cardiovascular Disease | Admitting: Cardiovascular Disease

## 2022-08-10 ENCOUNTER — Ambulatory Visit (INDEPENDENT_AMBULATORY_CARE_PROVIDER_SITE_OTHER): Payer: Medicare Other

## 2022-08-10 DIAGNOSIS — R0609 Other forms of dyspnea: Secondary | ICD-10-CM | POA: Diagnosis not present

## 2022-08-10 DIAGNOSIS — Z8249 Family history of ischemic heart disease and other diseases of the circulatory system: Secondary | ICD-10-CM

## 2022-08-11 LAB — ECHOCARDIOGRAM COMPLETE
Area-P 1/2: 4.49 cm2
S' Lateral: 2.58 cm

## 2022-08-16 DIAGNOSIS — M5416 Radiculopathy, lumbar region: Secondary | ICD-10-CM | POA: Diagnosis not present

## 2022-08-16 DIAGNOSIS — M545 Low back pain, unspecified: Secondary | ICD-10-CM | POA: Diagnosis not present

## 2022-08-20 DIAGNOSIS — M5416 Radiculopathy, lumbar region: Secondary | ICD-10-CM | POA: Diagnosis not present

## 2022-08-24 DIAGNOSIS — M5416 Radiculopathy, lumbar region: Secondary | ICD-10-CM | POA: Diagnosis not present

## 2022-08-24 DIAGNOSIS — M545 Low back pain, unspecified: Secondary | ICD-10-CM | POA: Diagnosis not present

## 2022-08-31 DIAGNOSIS — M545 Low back pain, unspecified: Secondary | ICD-10-CM | POA: Diagnosis not present

## 2022-08-31 DIAGNOSIS — M5416 Radiculopathy, lumbar region: Secondary | ICD-10-CM | POA: Diagnosis not present

## 2022-09-03 DIAGNOSIS — M5416 Radiculopathy, lumbar region: Secondary | ICD-10-CM | POA: Diagnosis not present

## 2022-09-06 DIAGNOSIS — M545 Low back pain, unspecified: Secondary | ICD-10-CM | POA: Diagnosis not present

## 2022-09-06 DIAGNOSIS — M5416 Radiculopathy, lumbar region: Secondary | ICD-10-CM | POA: Diagnosis not present

## 2022-09-13 DIAGNOSIS — M5416 Radiculopathy, lumbar region: Secondary | ICD-10-CM | POA: Diagnosis not present

## 2022-09-13 DIAGNOSIS — M545 Low back pain, unspecified: Secondary | ICD-10-CM | POA: Diagnosis not present

## 2022-09-20 DIAGNOSIS — M5416 Radiculopathy, lumbar region: Secondary | ICD-10-CM | POA: Diagnosis not present

## 2022-09-21 ENCOUNTER — Other Ambulatory Visit (HOSPITAL_COMMUNITY): Payer: Self-pay

## 2022-09-21 MED ORDER — ACCU-CHEK GUIDE VI STRP
ORAL_STRIP | 4 refills | Status: AC
  Filled 2022-09-21: qty 100, 90d supply, fill #0

## 2022-09-28 DIAGNOSIS — M5416 Radiculopathy, lumbar region: Secondary | ICD-10-CM | POA: Diagnosis not present

## 2022-09-28 DIAGNOSIS — M545 Low back pain, unspecified: Secondary | ICD-10-CM | POA: Diagnosis not present

## 2022-09-30 DIAGNOSIS — F419 Anxiety disorder, unspecified: Secondary | ICD-10-CM | POA: Diagnosis not present

## 2022-09-30 DIAGNOSIS — Z6833 Body mass index (BMI) 33.0-33.9, adult: Secondary | ICD-10-CM | POA: Diagnosis not present

## 2022-10-12 DIAGNOSIS — M5416 Radiculopathy, lumbar region: Secondary | ICD-10-CM | POA: Diagnosis not present

## 2022-10-12 DIAGNOSIS — M545 Low back pain, unspecified: Secondary | ICD-10-CM | POA: Diagnosis not present

## 2022-10-21 DIAGNOSIS — M5416 Radiculopathy, lumbar region: Secondary | ICD-10-CM | POA: Diagnosis not present

## 2022-11-03 DIAGNOSIS — H40013 Open angle with borderline findings, low risk, bilateral: Secondary | ICD-10-CM | POA: Diagnosis not present

## 2022-11-03 DIAGNOSIS — H0102A Squamous blepharitis right eye, upper and lower eyelids: Secondary | ICD-10-CM | POA: Diagnosis not present

## 2022-11-03 DIAGNOSIS — H43813 Vitreous degeneration, bilateral: Secondary | ICD-10-CM | POA: Diagnosis not present

## 2022-11-03 DIAGNOSIS — H0102B Squamous blepharitis left eye, upper and lower eyelids: Secondary | ICD-10-CM | POA: Diagnosis not present

## 2022-11-03 DIAGNOSIS — H2512 Age-related nuclear cataract, left eye: Secondary | ICD-10-CM | POA: Diagnosis not present

## 2022-11-03 DIAGNOSIS — Z961 Presence of intraocular lens: Secondary | ICD-10-CM | POA: Diagnosis not present

## 2022-12-13 DIAGNOSIS — M479 Spondylosis, unspecified: Secondary | ICD-10-CM | POA: Diagnosis not present

## 2022-12-13 DIAGNOSIS — E782 Mixed hyperlipidemia: Secondary | ICD-10-CM | POA: Diagnosis not present

## 2022-12-13 DIAGNOSIS — Z6833 Body mass index (BMI) 33.0-33.9, adult: Secondary | ICD-10-CM | POA: Diagnosis not present

## 2022-12-13 DIAGNOSIS — Z23 Encounter for immunization: Secondary | ICD-10-CM | POA: Diagnosis not present

## 2022-12-13 DIAGNOSIS — E119 Type 2 diabetes mellitus without complications: Secondary | ICD-10-CM | POA: Diagnosis not present

## 2022-12-13 DIAGNOSIS — F419 Anxiety disorder, unspecified: Secondary | ICD-10-CM | POA: Diagnosis not present

## 2022-12-16 DIAGNOSIS — M419 Scoliosis, unspecified: Secondary | ICD-10-CM | POA: Diagnosis not present

## 2022-12-16 DIAGNOSIS — M5416 Radiculopathy, lumbar region: Secondary | ICD-10-CM | POA: Diagnosis not present

## 2023-01-14 DIAGNOSIS — Z23 Encounter for immunization: Secondary | ICD-10-CM | POA: Diagnosis not present

## 2023-01-20 DIAGNOSIS — M542 Cervicalgia: Secondary | ICD-10-CM | POA: Diagnosis not present

## 2023-01-20 DIAGNOSIS — M546 Pain in thoracic spine: Secondary | ICD-10-CM | POA: Diagnosis not present

## 2023-01-20 DIAGNOSIS — M9903 Segmental and somatic dysfunction of lumbar region: Secondary | ICD-10-CM | POA: Diagnosis not present

## 2023-01-20 DIAGNOSIS — M545 Low back pain, unspecified: Secondary | ICD-10-CM | POA: Diagnosis not present

## 2023-01-20 DIAGNOSIS — M9901 Segmental and somatic dysfunction of cervical region: Secondary | ICD-10-CM | POA: Diagnosis not present

## 2023-01-20 DIAGNOSIS — M9902 Segmental and somatic dysfunction of thoracic region: Secondary | ICD-10-CM | POA: Diagnosis not present

## 2023-01-20 DIAGNOSIS — M62838 Other muscle spasm: Secondary | ICD-10-CM | POA: Diagnosis not present

## 2023-01-20 DIAGNOSIS — M62830 Muscle spasm of back: Secondary | ICD-10-CM | POA: Diagnosis not present

## 2023-01-25 DIAGNOSIS — M62838 Other muscle spasm: Secondary | ICD-10-CM | POA: Diagnosis not present

## 2023-01-25 DIAGNOSIS — M9902 Segmental and somatic dysfunction of thoracic region: Secondary | ICD-10-CM | POA: Diagnosis not present

## 2023-01-25 DIAGNOSIS — M9903 Segmental and somatic dysfunction of lumbar region: Secondary | ICD-10-CM | POA: Diagnosis not present

## 2023-01-25 DIAGNOSIS — M546 Pain in thoracic spine: Secondary | ICD-10-CM | POA: Diagnosis not present

## 2023-01-25 DIAGNOSIS — M62830 Muscle spasm of back: Secondary | ICD-10-CM | POA: Diagnosis not present

## 2023-01-25 DIAGNOSIS — M542 Cervicalgia: Secondary | ICD-10-CM | POA: Diagnosis not present

## 2023-01-25 DIAGNOSIS — M9901 Segmental and somatic dysfunction of cervical region: Secondary | ICD-10-CM | POA: Diagnosis not present

## 2023-01-25 DIAGNOSIS — M545 Low back pain, unspecified: Secondary | ICD-10-CM | POA: Diagnosis not present

## 2023-01-26 DIAGNOSIS — M62838 Other muscle spasm: Secondary | ICD-10-CM | POA: Diagnosis not present

## 2023-01-26 DIAGNOSIS — M545 Low back pain, unspecified: Secondary | ICD-10-CM | POA: Diagnosis not present

## 2023-01-26 DIAGNOSIS — M9903 Segmental and somatic dysfunction of lumbar region: Secondary | ICD-10-CM | POA: Diagnosis not present

## 2023-01-26 DIAGNOSIS — M9901 Segmental and somatic dysfunction of cervical region: Secondary | ICD-10-CM | POA: Diagnosis not present

## 2023-01-26 DIAGNOSIS — M62830 Muscle spasm of back: Secondary | ICD-10-CM | POA: Diagnosis not present

## 2023-01-26 DIAGNOSIS — M9902 Segmental and somatic dysfunction of thoracic region: Secondary | ICD-10-CM | POA: Diagnosis not present

## 2023-01-26 DIAGNOSIS — M546 Pain in thoracic spine: Secondary | ICD-10-CM | POA: Diagnosis not present

## 2023-01-26 DIAGNOSIS — M542 Cervicalgia: Secondary | ICD-10-CM | POA: Diagnosis not present

## 2023-01-27 DIAGNOSIS — M62838 Other muscle spasm: Secondary | ICD-10-CM | POA: Diagnosis not present

## 2023-01-27 DIAGNOSIS — M546 Pain in thoracic spine: Secondary | ICD-10-CM | POA: Diagnosis not present

## 2023-01-27 DIAGNOSIS — M542 Cervicalgia: Secondary | ICD-10-CM | POA: Diagnosis not present

## 2023-01-27 DIAGNOSIS — M62830 Muscle spasm of back: Secondary | ICD-10-CM | POA: Diagnosis not present

## 2023-01-27 DIAGNOSIS — M9902 Segmental and somatic dysfunction of thoracic region: Secondary | ICD-10-CM | POA: Diagnosis not present

## 2023-01-27 DIAGNOSIS — M9901 Segmental and somatic dysfunction of cervical region: Secondary | ICD-10-CM | POA: Diagnosis not present

## 2023-01-27 DIAGNOSIS — M9903 Segmental and somatic dysfunction of lumbar region: Secondary | ICD-10-CM | POA: Diagnosis not present

## 2023-01-27 DIAGNOSIS — M545 Low back pain, unspecified: Secondary | ICD-10-CM | POA: Diagnosis not present

## 2023-01-31 DIAGNOSIS — M545 Low back pain, unspecified: Secondary | ICD-10-CM | POA: Diagnosis not present

## 2023-01-31 DIAGNOSIS — M9902 Segmental and somatic dysfunction of thoracic region: Secondary | ICD-10-CM | POA: Diagnosis not present

## 2023-01-31 DIAGNOSIS — M546 Pain in thoracic spine: Secondary | ICD-10-CM | POA: Diagnosis not present

## 2023-01-31 DIAGNOSIS — M9901 Segmental and somatic dysfunction of cervical region: Secondary | ICD-10-CM | POA: Diagnosis not present

## 2023-01-31 DIAGNOSIS — M9903 Segmental and somatic dysfunction of lumbar region: Secondary | ICD-10-CM | POA: Diagnosis not present

## 2023-01-31 DIAGNOSIS — M542 Cervicalgia: Secondary | ICD-10-CM | POA: Diagnosis not present

## 2023-02-01 DIAGNOSIS — M9903 Segmental and somatic dysfunction of lumbar region: Secondary | ICD-10-CM | POA: Diagnosis not present

## 2023-02-01 DIAGNOSIS — M9902 Segmental and somatic dysfunction of thoracic region: Secondary | ICD-10-CM | POA: Diagnosis not present

## 2023-02-01 DIAGNOSIS — M546 Pain in thoracic spine: Secondary | ICD-10-CM | POA: Diagnosis not present

## 2023-02-01 DIAGNOSIS — M542 Cervicalgia: Secondary | ICD-10-CM | POA: Diagnosis not present

## 2023-02-01 DIAGNOSIS — M545 Low back pain, unspecified: Secondary | ICD-10-CM | POA: Diagnosis not present

## 2023-02-01 DIAGNOSIS — M9901 Segmental and somatic dysfunction of cervical region: Secondary | ICD-10-CM | POA: Diagnosis not present

## 2023-02-07 DIAGNOSIS — M9902 Segmental and somatic dysfunction of thoracic region: Secondary | ICD-10-CM | POA: Diagnosis not present

## 2023-02-07 DIAGNOSIS — M546 Pain in thoracic spine: Secondary | ICD-10-CM | POA: Diagnosis not present

## 2023-02-07 DIAGNOSIS — M545 Low back pain, unspecified: Secondary | ICD-10-CM | POA: Diagnosis not present

## 2023-02-07 DIAGNOSIS — M9901 Segmental and somatic dysfunction of cervical region: Secondary | ICD-10-CM | POA: Diagnosis not present

## 2023-02-07 DIAGNOSIS — M542 Cervicalgia: Secondary | ICD-10-CM | POA: Diagnosis not present

## 2023-02-07 DIAGNOSIS — M9903 Segmental and somatic dysfunction of lumbar region: Secondary | ICD-10-CM | POA: Diagnosis not present

## 2023-02-09 DIAGNOSIS — M545 Low back pain, unspecified: Secondary | ICD-10-CM | POA: Diagnosis not present

## 2023-02-09 DIAGNOSIS — M9903 Segmental and somatic dysfunction of lumbar region: Secondary | ICD-10-CM | POA: Diagnosis not present

## 2023-02-09 DIAGNOSIS — M542 Cervicalgia: Secondary | ICD-10-CM | POA: Diagnosis not present

## 2023-02-09 DIAGNOSIS — M9902 Segmental and somatic dysfunction of thoracic region: Secondary | ICD-10-CM | POA: Diagnosis not present

## 2023-02-09 DIAGNOSIS — M9901 Segmental and somatic dysfunction of cervical region: Secondary | ICD-10-CM | POA: Diagnosis not present

## 2023-02-09 DIAGNOSIS — M546 Pain in thoracic spine: Secondary | ICD-10-CM | POA: Diagnosis not present

## 2023-02-10 DIAGNOSIS — M542 Cervicalgia: Secondary | ICD-10-CM | POA: Diagnosis not present

## 2023-02-10 DIAGNOSIS — M545 Low back pain, unspecified: Secondary | ICD-10-CM | POA: Diagnosis not present

## 2023-02-10 DIAGNOSIS — M9903 Segmental and somatic dysfunction of lumbar region: Secondary | ICD-10-CM | POA: Diagnosis not present

## 2023-02-10 DIAGNOSIS — H04123 Dry eye syndrome of bilateral lacrimal glands: Secondary | ICD-10-CM | POA: Diagnosis not present

## 2023-02-10 DIAGNOSIS — M546 Pain in thoracic spine: Secondary | ICD-10-CM | POA: Diagnosis not present

## 2023-02-10 DIAGNOSIS — M9902 Segmental and somatic dysfunction of thoracic region: Secondary | ICD-10-CM | POA: Diagnosis not present

## 2023-02-10 DIAGNOSIS — M9901 Segmental and somatic dysfunction of cervical region: Secondary | ICD-10-CM | POA: Diagnosis not present

## 2023-02-14 DIAGNOSIS — M542 Cervicalgia: Secondary | ICD-10-CM | POA: Diagnosis not present

## 2023-02-14 DIAGNOSIS — M546 Pain in thoracic spine: Secondary | ICD-10-CM | POA: Diagnosis not present

## 2023-02-14 DIAGNOSIS — M9903 Segmental and somatic dysfunction of lumbar region: Secondary | ICD-10-CM | POA: Diagnosis not present

## 2023-02-14 DIAGNOSIS — M545 Low back pain, unspecified: Secondary | ICD-10-CM | POA: Diagnosis not present

## 2023-02-14 DIAGNOSIS — M9902 Segmental and somatic dysfunction of thoracic region: Secondary | ICD-10-CM | POA: Diagnosis not present

## 2023-02-14 DIAGNOSIS — M9901 Segmental and somatic dysfunction of cervical region: Secondary | ICD-10-CM | POA: Diagnosis not present

## 2023-02-16 DIAGNOSIS — M545 Low back pain, unspecified: Secondary | ICD-10-CM | POA: Diagnosis not present

## 2023-02-16 DIAGNOSIS — M542 Cervicalgia: Secondary | ICD-10-CM | POA: Diagnosis not present

## 2023-02-16 DIAGNOSIS — M9902 Segmental and somatic dysfunction of thoracic region: Secondary | ICD-10-CM | POA: Diagnosis not present

## 2023-02-16 DIAGNOSIS — M546 Pain in thoracic spine: Secondary | ICD-10-CM | POA: Diagnosis not present

## 2023-02-16 DIAGNOSIS — M9903 Segmental and somatic dysfunction of lumbar region: Secondary | ICD-10-CM | POA: Diagnosis not present

## 2023-02-16 DIAGNOSIS — M9901 Segmental and somatic dysfunction of cervical region: Secondary | ICD-10-CM | POA: Diagnosis not present

## 2023-02-17 DIAGNOSIS — M9902 Segmental and somatic dysfunction of thoracic region: Secondary | ICD-10-CM | POA: Diagnosis not present

## 2023-02-17 DIAGNOSIS — M9901 Segmental and somatic dysfunction of cervical region: Secondary | ICD-10-CM | POA: Diagnosis not present

## 2023-02-17 DIAGNOSIS — M9903 Segmental and somatic dysfunction of lumbar region: Secondary | ICD-10-CM | POA: Diagnosis not present

## 2023-02-17 DIAGNOSIS — M546 Pain in thoracic spine: Secondary | ICD-10-CM | POA: Diagnosis not present

## 2023-02-17 DIAGNOSIS — M542 Cervicalgia: Secondary | ICD-10-CM | POA: Diagnosis not present

## 2023-02-17 DIAGNOSIS — M545 Low back pain, unspecified: Secondary | ICD-10-CM | POA: Diagnosis not present

## 2023-03-15 ENCOUNTER — Other Ambulatory Visit: Payer: Self-pay | Admitting: Family Medicine

## 2023-03-15 DIAGNOSIS — Z1231 Encounter for screening mammogram for malignant neoplasm of breast: Secondary | ICD-10-CM

## 2023-04-13 DIAGNOSIS — E559 Vitamin D deficiency, unspecified: Secondary | ICD-10-CM | POA: Diagnosis not present

## 2023-04-13 DIAGNOSIS — M479 Spondylosis, unspecified: Secondary | ICD-10-CM | POA: Diagnosis not present

## 2023-04-13 DIAGNOSIS — K219 Gastro-esophageal reflux disease without esophagitis: Secondary | ICD-10-CM | POA: Diagnosis not present

## 2023-04-13 DIAGNOSIS — F339 Major depressive disorder, recurrent, unspecified: Secondary | ICD-10-CM | POA: Diagnosis not present

## 2023-04-13 DIAGNOSIS — Z0000 Encounter for general adult medical examination without abnormal findings: Secondary | ICD-10-CM | POA: Diagnosis not present

## 2023-04-13 DIAGNOSIS — E118 Type 2 diabetes mellitus with unspecified complications: Secondary | ICD-10-CM | POA: Diagnosis not present

## 2023-04-13 DIAGNOSIS — I10 Essential (primary) hypertension: Secondary | ICD-10-CM | POA: Diagnosis not present

## 2023-04-13 DIAGNOSIS — Z6832 Body mass index (BMI) 32.0-32.9, adult: Secondary | ICD-10-CM | POA: Diagnosis not present

## 2023-04-19 ENCOUNTER — Ambulatory Visit
Admission: RE | Admit: 2023-04-19 | Discharge: 2023-04-19 | Disposition: A | Discharge status: Home / Self Care | Payer: Medicare Other | Source: Ambulatory Visit | Attending: Family Medicine | Admitting: Family Medicine

## 2023-04-19 DIAGNOSIS — Z1231 Encounter for screening mammogram for malignant neoplasm of breast: Secondary | ICD-10-CM

## 2023-05-12 DIAGNOSIS — R051 Acute cough: Secondary | ICD-10-CM | POA: Diagnosis not present

## 2023-05-12 DIAGNOSIS — Z6832 Body mass index (BMI) 32.0-32.9, adult: Secondary | ICD-10-CM | POA: Diagnosis not present

## 2023-05-12 DIAGNOSIS — J069 Acute upper respiratory infection, unspecified: Secondary | ICD-10-CM | POA: Diagnosis not present

## 2023-05-12 DIAGNOSIS — R509 Fever, unspecified: Secondary | ICD-10-CM | POA: Diagnosis not present

## 2023-05-12 DIAGNOSIS — R062 Wheezing: Secondary | ICD-10-CM | POA: Diagnosis not present

## 2023-05-12 DIAGNOSIS — R42 Dizziness and giddiness: Secondary | ICD-10-CM | POA: Diagnosis not present

## 2023-05-12 DIAGNOSIS — Z03818 Encounter for observation for suspected exposure to other biological agents ruled out: Secondary | ICD-10-CM | POA: Diagnosis not present

## 2023-05-27 DIAGNOSIS — I10 Essential (primary) hypertension: Secondary | ICD-10-CM | POA: Diagnosis not present

## 2023-05-27 DIAGNOSIS — E118 Type 2 diabetes mellitus with unspecified complications: Secondary | ICD-10-CM | POA: Diagnosis not present

## 2023-05-27 DIAGNOSIS — E119 Type 2 diabetes mellitus without complications: Secondary | ICD-10-CM | POA: Diagnosis not present

## 2023-06-06 DIAGNOSIS — E119 Type 2 diabetes mellitus without complications: Secondary | ICD-10-CM | POA: Diagnosis not present

## 2023-06-06 DIAGNOSIS — I10 Essential (primary) hypertension: Secondary | ICD-10-CM | POA: Diagnosis not present

## 2023-06-06 DIAGNOSIS — F339 Major depressive disorder, recurrent, unspecified: Secondary | ICD-10-CM | POA: Diagnosis not present

## 2023-06-06 DIAGNOSIS — M479 Spondylosis, unspecified: Secondary | ICD-10-CM | POA: Diagnosis not present

## 2023-06-06 DIAGNOSIS — E118 Type 2 diabetes mellitus with unspecified complications: Secondary | ICD-10-CM | POA: Diagnosis not present

## 2023-06-13 DIAGNOSIS — I10 Essential (primary) hypertension: Secondary | ICD-10-CM | POA: Diagnosis not present

## 2023-06-13 DIAGNOSIS — F339 Major depressive disorder, recurrent, unspecified: Secondary | ICD-10-CM | POA: Diagnosis not present

## 2023-06-13 DIAGNOSIS — F419 Anxiety disorder, unspecified: Secondary | ICD-10-CM | POA: Diagnosis not present

## 2023-06-13 DIAGNOSIS — E118 Type 2 diabetes mellitus with unspecified complications: Secondary | ICD-10-CM | POA: Diagnosis not present

## 2023-06-13 DIAGNOSIS — Z6832 Body mass index (BMI) 32.0-32.9, adult: Secondary | ICD-10-CM | POA: Diagnosis not present

## 2023-06-20 DIAGNOSIS — H04123 Dry eye syndrome of bilateral lacrimal glands: Secondary | ICD-10-CM | POA: Diagnosis not present

## 2023-06-20 DIAGNOSIS — H40013 Open angle with borderline findings, low risk, bilateral: Secondary | ICD-10-CM | POA: Diagnosis not present

## 2023-06-20 DIAGNOSIS — Z961 Presence of intraocular lens: Secondary | ICD-10-CM | POA: Diagnosis not present

## 2023-06-20 DIAGNOSIS — H43813 Vitreous degeneration, bilateral: Secondary | ICD-10-CM | POA: Diagnosis not present

## 2023-06-20 DIAGNOSIS — H2512 Age-related nuclear cataract, left eye: Secondary | ICD-10-CM | POA: Diagnosis not present

## 2023-06-25 DIAGNOSIS — E118 Type 2 diabetes mellitus with unspecified complications: Secondary | ICD-10-CM | POA: Diagnosis not present

## 2023-06-25 DIAGNOSIS — I10 Essential (primary) hypertension: Secondary | ICD-10-CM | POA: Diagnosis not present

## 2023-06-25 DIAGNOSIS — E119 Type 2 diabetes mellitus without complications: Secondary | ICD-10-CM | POA: Diagnosis not present

## 2023-07-06 DIAGNOSIS — F339 Major depressive disorder, recurrent, unspecified: Secondary | ICD-10-CM | POA: Diagnosis not present

## 2023-07-06 DIAGNOSIS — E119 Type 2 diabetes mellitus without complications: Secondary | ICD-10-CM | POA: Diagnosis not present

## 2023-07-06 DIAGNOSIS — I10 Essential (primary) hypertension: Secondary | ICD-10-CM | POA: Diagnosis not present

## 2023-07-06 DIAGNOSIS — M479 Spondylosis, unspecified: Secondary | ICD-10-CM | POA: Diagnosis not present

## 2023-07-06 DIAGNOSIS — E118 Type 2 diabetes mellitus with unspecified complications: Secondary | ICD-10-CM | POA: Diagnosis not present

## 2023-07-15 DIAGNOSIS — Z6832 Body mass index (BMI) 32.0-32.9, adult: Secondary | ICD-10-CM | POA: Diagnosis not present

## 2023-07-15 DIAGNOSIS — I10 Essential (primary) hypertension: Secondary | ICD-10-CM | POA: Diagnosis not present

## 2023-07-15 DIAGNOSIS — E118 Type 2 diabetes mellitus with unspecified complications: Secondary | ICD-10-CM | POA: Diagnosis not present

## 2023-07-15 DIAGNOSIS — F419 Anxiety disorder, unspecified: Secondary | ICD-10-CM | POA: Diagnosis not present

## 2023-07-15 DIAGNOSIS — F339 Major depressive disorder, recurrent, unspecified: Secondary | ICD-10-CM | POA: Diagnosis not present

## 2023-07-25 DIAGNOSIS — I10 Essential (primary) hypertension: Secondary | ICD-10-CM | POA: Diagnosis not present

## 2023-07-25 DIAGNOSIS — E119 Type 2 diabetes mellitus without complications: Secondary | ICD-10-CM | POA: Diagnosis not present

## 2023-07-25 DIAGNOSIS — E118 Type 2 diabetes mellitus with unspecified complications: Secondary | ICD-10-CM | POA: Diagnosis not present

## 2023-08-06 DIAGNOSIS — E118 Type 2 diabetes mellitus with unspecified complications: Secondary | ICD-10-CM | POA: Diagnosis not present

## 2023-08-06 DIAGNOSIS — I10 Essential (primary) hypertension: Secondary | ICD-10-CM | POA: Diagnosis not present

## 2023-08-06 DIAGNOSIS — M479 Spondylosis, unspecified: Secondary | ICD-10-CM | POA: Diagnosis not present

## 2023-08-06 DIAGNOSIS — E119 Type 2 diabetes mellitus without complications: Secondary | ICD-10-CM | POA: Diagnosis not present

## 2023-08-06 DIAGNOSIS — F339 Major depressive disorder, recurrent, unspecified: Secondary | ICD-10-CM | POA: Diagnosis not present

## 2023-08-12 DIAGNOSIS — E118 Type 2 diabetes mellitus with unspecified complications: Secondary | ICD-10-CM | POA: Diagnosis not present

## 2023-08-12 DIAGNOSIS — F339 Major depressive disorder, recurrent, unspecified: Secondary | ICD-10-CM | POA: Diagnosis not present

## 2023-08-12 DIAGNOSIS — F419 Anxiety disorder, unspecified: Secondary | ICD-10-CM | POA: Diagnosis not present

## 2023-08-12 DIAGNOSIS — I10 Essential (primary) hypertension: Secondary | ICD-10-CM | POA: Diagnosis not present

## 2023-08-24 DIAGNOSIS — E119 Type 2 diabetes mellitus without complications: Secondary | ICD-10-CM | POA: Diagnosis not present

## 2023-08-24 DIAGNOSIS — I10 Essential (primary) hypertension: Secondary | ICD-10-CM | POA: Diagnosis not present

## 2023-08-24 DIAGNOSIS — E118 Type 2 diabetes mellitus with unspecified complications: Secondary | ICD-10-CM | POA: Diagnosis not present

## 2023-09-05 ENCOUNTER — Ambulatory Visit (INDEPENDENT_AMBULATORY_CARE_PROVIDER_SITE_OTHER): Admitting: Family Medicine

## 2023-09-05 ENCOUNTER — Ambulatory Visit: Payer: Self-pay | Admitting: Family Medicine

## 2023-09-05 ENCOUNTER — Encounter: Payer: Self-pay | Admitting: Family Medicine

## 2023-09-05 VITALS — BP 136/68 | HR 85 | Ht 62.75 in | Wt 175.8 lb

## 2023-09-05 DIAGNOSIS — M479 Spondylosis, unspecified: Secondary | ICD-10-CM | POA: Diagnosis not present

## 2023-09-05 DIAGNOSIS — I159 Secondary hypertension, unspecified: Secondary | ICD-10-CM | POA: Diagnosis not present

## 2023-09-05 DIAGNOSIS — R011 Cardiac murmur, unspecified: Secondary | ICD-10-CM | POA: Insufficient documentation

## 2023-09-05 DIAGNOSIS — I10 Essential (primary) hypertension: Secondary | ICD-10-CM | POA: Insufficient documentation

## 2023-09-05 DIAGNOSIS — F339 Major depressive disorder, recurrent, unspecified: Secondary | ICD-10-CM

## 2023-09-05 DIAGNOSIS — R7303 Prediabetes: Secondary | ICD-10-CM

## 2023-09-05 DIAGNOSIS — E118 Type 2 diabetes mellitus with unspecified complications: Secondary | ICD-10-CM | POA: Diagnosis not present

## 2023-09-05 DIAGNOSIS — R7989 Other specified abnormal findings of blood chemistry: Secondary | ICD-10-CM

## 2023-09-05 DIAGNOSIS — Z8639 Personal history of other endocrine, nutritional and metabolic disease: Secondary | ICD-10-CM

## 2023-09-05 DIAGNOSIS — F329 Major depressive disorder, single episode, unspecified: Secondary | ICD-10-CM | POA: Insufficient documentation

## 2023-09-05 DIAGNOSIS — E119 Type 2 diabetes mellitus without complications: Secondary | ICD-10-CM | POA: Diagnosis not present

## 2023-09-05 LAB — POCT GLYCOSYLATED HEMOGLOBIN (HGB A1C): HbA1c, POC (prediabetic range): 6.6 % — AB (ref 5.7–6.4)

## 2023-09-05 NOTE — Addendum Note (Signed)
 Addended by: Canden Cieslinski L on: 09/05/2023 02:16 PM   Modules accepted: Orders

## 2023-09-05 NOTE — Assessment & Plan Note (Signed)
 At goal today, recently started on chlorthalidone and tolerating well, repeat BMP

## 2023-09-05 NOTE — Patient Instructions (Addendum)
 It was wonderful to see you today.  Please bring ALL of your medications with you to every visit.   Today we talked about:  - We are checking lab work today, your blood pressure looks good  - For information on therapists, please go to [email or web address]. You can also contact your insurance company to find an Hospital doctor.   Thank you for choosing [facility].   Please call [phone] with any questions about today's appointment.  Please arrive at least 15 minutes prior to your scheduled appointments.   If you had blood work today, I will send you a MyChart message or a letter if results are normal. Otherwise, I will give you a call.   If you had a referral placed, they will call you to set up an appointment. Please give us  a call if you don't hear back in the next 2 weeks.   If you need additional refills before your next appointment, please call your pharmacy first.   Rollene Keeling, MD  Family Medicine

## 2023-09-05 NOTE — Assessment & Plan Note (Signed)
-

## 2023-09-05 NOTE — Assessment & Plan Note (Signed)
 Continue Prozac  Given psychology today resources to establish with therapist, many current life stressors [employer] network supportive for her Denies SI/HI F/u 1 month
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
## 2023-09-05 NOTE — Assessment & Plan Note (Signed)
 Denies chest pain or palpitations or syncope or shortness of breath Had cardiac work up last year including ECHO and CT coronoary that was wnl

## 2023-09-06 LAB — BASIC METABOLIC PANEL WITH GFR
BUN/Creatinine Ratio: 17 (ref 12–28)
BUN: 16 mg/dL (ref 8–27)
CO2: 24 mmol/L (ref 20–29)
Calcium: 10.6 mg/dL — ABNORMAL HIGH (ref 8.7–10.3)
Chloride: 94 mmol/L — ABNORMAL LOW (ref 96–106)
Creatinine, Ser: 0.96 mg/dL (ref 0.57–1.00)
Glucose: 182 mg/dL — ABNORMAL HIGH (ref 70–99)
Potassium: 4.2 mmol/L (ref 3.5–5.2)
Sodium: 136 mmol/L (ref 134–144)
eGFR: 64 mL/min/{1.73_m2} (ref >59)

## 2023-09-06 LAB — TSH RFX ON ABNORMAL TO FREE T4: TSH: 0.491 u[IU]/mL (ref 0.450–4.500)

## 2023-09-08 ENCOUNTER — Other Ambulatory Visit: Payer: Self-pay | Admitting: Family Medicine

## 2023-09-08 DIAGNOSIS — E1165 Type 2 diabetes mellitus with hyperglycemia: Secondary | ICD-10-CM

## 2023-09-08 NOTE — Progress Notes (Signed)
Future order A1-c placed.

## 2023-09-13 ENCOUNTER — Other Ambulatory Visit: Payer: Self-pay | Admitting: Family Medicine

## 2023-09-13 DIAGNOSIS — I10 Essential (primary) hypertension: Secondary | ICD-10-CM

## 2023-09-22 ENCOUNTER — Other Ambulatory Visit

## 2023-09-22 DIAGNOSIS — E1165 Type 2 diabetes mellitus with hyperglycemia: Secondary | ICD-10-CM

## 2023-09-22 DIAGNOSIS — I10 Essential (primary) hypertension: Secondary | ICD-10-CM | POA: Diagnosis not present

## 2023-09-22 LAB — POCT GLYCOSYLATED HEMOGLOBIN (HGB A1C): HbA1c, POC (controlled diabetic range): 6.5 % (ref 0.0–7.0)

## 2023-09-23 DIAGNOSIS — I10 Essential (primary) hypertension: Secondary | ICD-10-CM | POA: Diagnosis not present

## 2023-09-23 DIAGNOSIS — E118 Type 2 diabetes mellitus with unspecified complications: Secondary | ICD-10-CM | POA: Diagnosis not present

## 2023-09-23 DIAGNOSIS — E119 Type 2 diabetes mellitus without complications: Secondary | ICD-10-CM | POA: Diagnosis not present

## 2023-09-23 LAB — BASIC METABOLIC PANEL WITH GFR
BUN/Creatinine Ratio: 12 (ref 12–28)
BUN: 10 mg/dL (ref 8–27)
CO2: 22 mmol/L (ref 20–29)
Calcium: 10.1 mg/dL (ref 8.7–10.3)
Chloride: 99 mmol/L (ref 96–106)
Creatinine, Ser: 0.83 mg/dL (ref 0.57–1.00)
Glucose: 148 mg/dL — ABNORMAL HIGH (ref 70–99)
Potassium: 4.9 mmol/L (ref 3.5–5.2)
Sodium: 136 mmol/L (ref 134–144)
eGFR: 76 mL/min/1.73 (ref >59)

## 2023-09-30 ENCOUNTER — Ambulatory Visit (INDEPENDENT_AMBULATORY_CARE_PROVIDER_SITE_OTHER): Admitting: Family Medicine

## 2023-09-30 ENCOUNTER — Other Ambulatory Visit (HOSPITAL_COMMUNITY): Payer: Self-pay

## 2023-09-30 ENCOUNTER — Encounter: Payer: Self-pay | Admitting: Family Medicine

## 2023-09-30 ENCOUNTER — Encounter (HOSPITAL_BASED_OUTPATIENT_CLINIC_OR_DEPARTMENT_OTHER): Payer: Self-pay

## 2023-09-30 VITALS — BP 139/76 | HR 81 | Resp 19 | Ht 62.75 in | Wt 176.2 lb

## 2023-09-30 DIAGNOSIS — Z8619 Personal history of other infectious and parasitic diseases: Secondary | ICD-10-CM

## 2023-09-30 DIAGNOSIS — E119 Type 2 diabetes mellitus without complications: Secondary | ICD-10-CM

## 2023-09-30 DIAGNOSIS — E785 Hyperlipidemia, unspecified: Secondary | ICD-10-CM | POA: Diagnosis not present

## 2023-09-30 DIAGNOSIS — F339 Major depressive disorder, recurrent, unspecified: Secondary | ICD-10-CM | POA: Diagnosis not present

## 2023-09-30 DIAGNOSIS — R0683 Snoring: Secondary | ICD-10-CM | POA: Diagnosis not present

## 2023-09-30 DIAGNOSIS — I10 Essential (primary) hypertension: Secondary | ICD-10-CM | POA: Diagnosis not present

## 2023-09-30 DIAGNOSIS — E1169 Type 2 diabetes mellitus with other specified complication: Secondary | ICD-10-CM | POA: Diagnosis not present

## 2023-09-30 MED ORDER — ACYCLOVIR 5 % EX OINT
1.0000 | TOPICAL_OINTMENT | CUTANEOUS | 2 refills | Status: AC | PRN
Start: 2023-09-30 — End: ?
  Filled 2023-09-30 – 2023-11-15 (×2): qty 30, 30d supply, fill #0

## 2023-09-30 MED ORDER — ACYCLOVIR 5 % EX OINT
1.0000 | TOPICAL_OINTMENT | CUTANEOUS | 2 refills | Status: DC | PRN
Start: 2023-09-30 — End: 2023-09-30

## 2023-09-30 MED ORDER — FENOFIBRATE 160 MG PO TABS
160.0000 mg | ORAL_TABLET | Freq: Every day | ORAL | 3 refills | Status: AC
  Filled 2023-09-30 – 2023-11-15 (×2): qty 90, 90d supply, fill #0
  Filled 2024-02-20: qty 90, 90d supply, fill #1

## 2023-09-30 MED ORDER — LISINOPRIL 40 MG PO TABS
40.0000 mg | ORAL_TABLET | Freq: Every day | ORAL | 3 refills | Status: AC
  Filled 2023-09-30 – 2023-11-21 (×6): qty 90, 90d supply, fill #0
  Filled 2024-02-20: qty 90, 90d supply, fill #1

## 2023-09-30 MED ORDER — FLUOXETINE HCL 40 MG PO CAPS
40.0000 mg | ORAL_CAPSULE | Freq: Every day | ORAL | 3 refills | Status: AC
  Filled 2023-09-30 – 2023-11-15 (×2): qty 90, 90d supply, fill #0
  Filled 2024-02-20: qty 90, 90d supply, fill #1

## 2023-09-30 NOTE — Assessment & Plan Note (Signed)
 At goal, recent BMP after on lisinopril  for several weeks wnl, continue

## 2023-09-30 NOTE — Patient Instructions (Addendum)
 It was wonderful to see you today.  Please bring ALL of your medications with you to every visit.   Today we talked about:  - For your diabetes- You are already getting yearly eye exams, this is great!  We will check your urine today for protein. Currently you are not at a level of needing medications, but if you have a lot of protein in your urine we can consider starting a medication to protect your kidneys.   You are due for your Shingles vaccine, you can get this at any pharmacy, it is a 2 vaccine series.   Thank you for choosing [facility].   Please call [phone] with any questions about today's appointment.  Please arrive at least 15 minutes prior to your scheduled appointments.   If you had blood work today, I will send you a MyChart message or a letter if results are normal. Otherwise, I will give you a call.   If you had a referral placed, they will call you to set up an appointment. Please give us  a call if you don't hear back in the next 2 weeks.   If you need additional refills before your next appointment, please call your pharmacy first.  Don't forget to check out the [facility] in [facility] at [address] [phone] Affordable prices on prescriptions and over-the-counter items, as well as services like vaccinations and medication home delivery.   Rollene Keeling, MD  Family Medicine

## 2023-09-30 NOTE — Assessment & Plan Note (Addendum)
 Confirmed with A1c x 2 Discussed in depth today, due for urine testing, discussed eye exam which she already gets yearly If proteinuria consider starting SGLT2, she is currently on ACEI as below

## 2023-09-30 NOTE — Assessment & Plan Note (Signed)
 Seeing therapist, helpful, no SI, continue home fluoxetine  40mg  daily

## 2023-09-30 NOTE — Progress Notes (Signed)
    SUBJECTIVE:   CHIEF COMPLAINT / HPI:   HTN- stopped chlorthalidone due to hypercalcemia, now resolved on follow up. On lisinopril  40mg  daily. Taking 40mg  daily for several weeks, recent Bmp normal.   T2DM- new diagnosis confirmed last visit. Doing ok with it. Knows she could exercise more and eat better. Already gets yearly eye exams at [facility].  Low back pain/sciatica- improved, so she weaned herself off of gabapentin  and no longer taking.  Depression- started seeing a therapist weekly, very helpful. Son is doing better, working through things with her husband and hurtful statements he has made. Her sister has started drinking again and continues to be hard to set boundaries with. Denies SI. Still taking prozac . Sleeping ok off of the gabapentin .   H/o HSV- uses topical acyclovir PRN, would like refill.  Snoring- spent the weekend with her daughter at the beach and was noted to be snoring, gasping for breath, and having apneic episodes. Has a sleep study maybe 30 years ago that was normal, would like to do again. Doesn't feel rested even when sleeps overnight.   PERTINENT  PMH / PSH: HTN, T2DM, depression, sciatica  OBJECTIVE:   BP 139/76 (BP Location: Left Arm, Patient Position: Sitting, Cuff Size: Large)   Pulse 81   Resp 19   Ht 5' 2.75 (1.594 m)   Wt 176 lb 3.2 oz (79.9 kg)   LMP 03/08/2009 (Approximate)   SpO2 97%   BMI 31.46 kg/m   General: A&O, NAD HEENT: No sign of trauma, EOM grossly intact Cardiac: RRR, + murmur Respiratory: CTAB, normal WOB, no w/c/r GI: Soft, NTTP, non-distended  Extremities: NTTP, no peripheral edema. Neuro: Normal gait, moves all four extremities appropriately. Psych: Appropriate mood and affect   ASSESSMENT/PLAN:   Assessment & Plan Controlled type 2 diabetes mellitus without complication, without long-term current use of insulin (HCC) Confirmed with A1c x 2 Discussed in depth today, due for urine testing, discussed eye exam  which she already gets yearly If proteinuria consider starting SGLT2, she is currently on ACEI as below Primary hypertension At goal, recent BMP after on lisinopril  for several weeks wnl, continue History of herpes simplex infection Topical acyclovir refilled has had both cold sores and genital lesions Recurrent major depressive disorder, remission status unspecified (HCC) Seeing therapist, helpful, no SI, continue home fluoxetine  40mg  daily Hyperlipidemia associated with type 2 diabetes mellitus (HCC) Continue home fenofibrate , previously intolerant of statin, repeat lipid panel at follow up she will check [facility] records to see when last checked Snoring Referral to pulm for sleep apnea study Discussed this could be contributing to her HTN as well     Rollene FORBES Keeling, MD [facility]

## 2023-10-01 LAB — MICROALBUMIN / CREATININE URINE RATIO
Creatinine, Urine: 77.9 mg/dL
Microalb/Creat Ratio: 10 mg/g{creat} (ref 0–29)
Microalbumin, Urine: 7.8 ug/mL

## 2023-10-03 ENCOUNTER — Ambulatory Visit: Payer: Self-pay | Admitting: Family Medicine

## 2023-10-11 ENCOUNTER — Other Ambulatory Visit (HOSPITAL_COMMUNITY): Payer: Self-pay

## 2023-11-08 ENCOUNTER — Telehealth: Payer: Self-pay

## 2023-11-08 ENCOUNTER — Other Ambulatory Visit (HOSPITAL_COMMUNITY): Payer: Self-pay

## 2023-11-08 DIAGNOSIS — U071 COVID-19: Secondary | ICD-10-CM

## 2023-11-08 MED ORDER — PAXLOVID (300/100) 20 X 150 MG & 10 X 100MG PO TBPK
3.0000 | ORAL_TABLET | Freq: Two times a day (BID) | ORAL | 0 refills | Status: AC
Start: 2023-11-08 — End: 2023-11-13
  Filled 2023-11-08: qty 30, 5d supply, fill #0

## 2023-11-08 NOTE — Telephone Encounter (Signed)
 Called and spoke with patient. Symptoms started 11/06/23, tested positive yesterday 11/07/23.  Discussed Paxlovid  and data behind use, discussed risks of dysgeusia, GI side effects, and rebound symptoms. Checked her home medications and no significant interactions- did discuss low risk of slightly increased fluoxetine  concentrations based on metabolism, she takes 40mg  daily and this is expected to be a weak risk and not affect Qtc prolongation and thus not a contraindication.  She notes feeling fatigued, denies shortness of breath, chest pain, leg swelling, hemoptysis. Discussed these are return precautions.  Paxlovid  sent to pharmacy, normal GFR and no liver disease.  Rollene Keeling MD

## 2023-11-08 NOTE — Telephone Encounter (Signed)
 Patient calls nurse line regarding testing positive for COVID. Positive test was on 11/07/23, with symptom onset of Sunday, 8/31.  Patient reports sore throat, fatigue, cough, nasal drainage, decreased appetite and body aches.   She reports that this is her first time having COVID. Her sister recommended reaching out to PCP to determine if Paxlovid  would be a good option for her.   Advised that I would forward to provider for review. Supportive measures and ED precautions discussed.   Chiquita JAYSON English, RN

## 2023-11-15 ENCOUNTER — Other Ambulatory Visit: Payer: Self-pay

## 2023-11-15 ENCOUNTER — Other Ambulatory Visit (HOSPITAL_COMMUNITY): Payer: Self-pay

## 2023-11-16 ENCOUNTER — Other Ambulatory Visit (HOSPITAL_COMMUNITY): Payer: Self-pay

## 2023-11-16 ENCOUNTER — Other Ambulatory Visit: Payer: Self-pay

## 2023-11-17 ENCOUNTER — Encounter: Payer: Self-pay | Admitting: Pharmacist

## 2023-11-17 ENCOUNTER — Other Ambulatory Visit (HOSPITAL_COMMUNITY): Payer: Self-pay

## 2023-11-17 ENCOUNTER — Other Ambulatory Visit: Payer: Self-pay

## 2023-11-21 ENCOUNTER — Other Ambulatory Visit (HOSPITAL_COMMUNITY): Payer: Self-pay

## 2023-11-22 ENCOUNTER — Ambulatory Visit: Admitting: Nurse Practitioner

## 2023-11-22 ENCOUNTER — Encounter: Payer: Self-pay | Admitting: Nurse Practitioner

## 2023-11-22 VITALS — BP 136/82 | HR 71 | Temp 98.3°F | Ht 62.5 in | Wt 175.2 lb

## 2023-11-22 DIAGNOSIS — E66811 Obesity, class 1: Secondary | ICD-10-CM | POA: Diagnosis not present

## 2023-11-22 DIAGNOSIS — G4719 Other hypersomnia: Secondary | ICD-10-CM | POA: Diagnosis not present

## 2023-11-22 DIAGNOSIS — Z6831 Body mass index (BMI) 31.0-31.9, adult: Secondary | ICD-10-CM | POA: Diagnosis not present

## 2023-11-22 DIAGNOSIS — R0683 Snoring: Secondary | ICD-10-CM

## 2023-11-22 NOTE — Progress Notes (Signed)
 @Patient  ID: Lori Walsh, female    DOB: [birth year], 68 y.o.   MRN: [id number]  Chief Complaint  Patient presents with   Consult    Sleep consult- pts family says she snores loudly, sleep talks, and gasping for air.  Daytime sleepiness, frequent naps.     Referring provider: Donzetta Rollene BRAVO, MD  HPI: 68 year old female, never smoker referred for sleep consult. Past medical history significant for HTN, DM, depression, cardiac murmur.  TEST/EVENTS:   11/22/2023: Today - sleep consult Discussed the use of AI scribe software for clinical note transcription with the patient, who gave verbal consent to proceed.  History of Present Illness Lori Walsh is a 68 year old female who presents for a sleep consult due to loud snoring and gasping during sleep.  She experiences loud snoring and gasping during sleep, as noted by her family. She does not wake up choking or gasping. She engages in sleep talking but not sleep walking. Daytime fatigue is prominent, with frequent naps. No morning headaches, drowsy driving, or falling asleep while driving.  A sleep study was conducted approximately 20 years ago, but she has never been on CPAP therapy. She perceives her symptoms have worsened over time. Her husband's sleep issues and her's have lead them to sleep in separate rooms. Her children have expressed concern about her snoring, particularly during a recent beach trip.  She has attempted to use a SnoreGuard, which she feels helps, but she cannot keep it in her mouth all night. She has not used any sleep medications consistently but has taken Nyquil recently due to post-COVID drainage. She previously used gabapentin  for a hip issue, which she has since discontinued after chiropractic relief.  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 She's retired now. She struggles with weight loss. She does not  want to do any medications at this time. She consumes wine occasionally and drinks two cups of coffee in the morning.   She has a history of terrible tonsils as a child, which were never removed.  She goes to bed around [phone] pm. Falls asleep typically within 45 min. Wakes 3 times a night. Gets up around 8 am. No significant weight change in last few years.   [place] [address]    Allergies  Allergen Reactions   Strawberry Extract Itching   Crestor [Rosuvastatin] Other (See Comments)    MUSCLE ACHES    Lipitor [Atorvastatin] Other (See Comments)    MUSCLE ACHES   Zocor [Simvastatin] Other (See Comments)    MUSCLE ACHES     Immunization History  Administered Date(s) Administered   Influenza Split 12/07/2018   Influenza-Unspecified 12/26/2018   PFIZER(Purple Top)SARS-COV-2 Vaccination 04/04/2019, 04/25/2019   Zoster, Live 08/04/2018    Past Medical History:  Diagnosis Date   Depression    Fibroadenoma of right breast    GERD (gastroesophageal reflux disease)    History of gestational diabetes    History of nuclear stress test    04-16-2014  normal w/ no ischemia , ef 82% (in epic)   Hyperlipidemia    Hypertension    Left wrist fracture    Pre-diabetes    Thyroid  nodule    bilateral --- previous bx , benign/   last ultrasound in epic 08-19-2017   TMJ syndrome     Tobacco History: Social History   Tobacco Use  Smoking Status Never  Smokeless Tobacco Never  Counseling given: Not Answered   Outpatient Medications Prior to Visit  Medication Sig Dispense Refill   Accu-Chek Softclix Lancets lancets Use as directed once daily 100 each 4   acyclovir  ointment (ZOVIRAX ) 5 % Apply 1 Application topically every 4 (four) hours as needed. 30 g 2   aspirin EC 81 MG tablet Take 81 mg by mouth at bedtime.      Blood Glucose Monitoring Suppl (ACCU-CHEK GUIDE) w/Device KIT Use as directed to check glucose once daily 1 kit 0   calcium carbonate (TUMS - DOSED IN MG ELEMENTAL  CALCIUM) 500 MG chewable tablet Chew 1 tablet by mouth as needed for indigestion or heartburn.     fenofibrate  160 MG tablet Take 1 tablet (160 mg total) by mouth daily. 90 tablet 3   FLUoxetine  (PROZAC ) 40 MG capsule Take 1 capsule (40 mg total) by mouth daily. 90 capsule 3   fluticasone  (FLONASE ) 50 MCG/ACT nasal spray Place 2 sprays into both nostrils 2 (two) times daily. 15.8 g 0   glucose blood (ACCU-CHEK GUIDE) test strip CHeck glucose once daily 100 strip 4   lisinopril  (ZESTRIL ) 40 MG tablet Take 1 tablet (40 mg total) by mouth daily. 90 tablet 3   Multiple Vitamin (MULTIVITAMIN) tablet Take 1 tablet by mouth daily.     trolamine salicylate (ASPERCREME) 10 % cream Apply 1 application topically as needed for muscle pain (hip).     Lancets (FREESTYLE) lancets      lansoprazole (PREVACID) 15 MG capsule Take 15 mg by mouth daily at 12 noon.     No facility-administered medications prior to visit.     Review of Systems: As above     Physical Exam:  BP 136/82   Pulse 71   Temp 98.3 F (36.8 C)   Ht 5' 2.5 (1.588 m)   Wt 175 lb 3.2 oz (79.5 kg)   LMP 03/08/2009 (Approximate)   SpO2 96%   BMI 31.53 kg/m   GEN: Pleasant, interactive, well-appearing; obese; in no acute distress HEENT:  Normocephalic and atraumatic. PERRLA. Sclera white. Nasal turbinates pink, moist and patent bilaterally. No rhinorrhea present. Oropharynx pink and moist, without exudate or edema. No lesions, ulcerations, or postnasal drip. Mallampati IV NECK:  Supple w/ fair ROM. No thyroidmegaly. No lymphadenopathy.   CV: RRR, no m/r/g PULMONARY:  Unlabored, regular breathing. Clear bilaterally A&P w/o wheezes/rales/rhonchi.  GI: BS present and normoactive. Soft, non-tender to palpation. No organomegaly or masses detected.  MSK: No erythema, warmth or tenderness. Cap refil <2 sec all extrem.  Neuro: A/Ox3. No focal deficits noted.   Skin: Warm, no lesions or rashe Psych: Normal affect and behavior. Judgement  and thought content appropriate.     Lab Results:  CBC    Component Value Date/Time   WBC 11.0 (H) 07/30/2022 2349   RBC 4.41 07/30/2022 2349   HGB 13.1 07/30/2022 2349   HCT 40.0 07/30/2022 2349   PLT 264 07/30/2022 2349   MCV 90.7 07/30/2022 2349   MCH 29.7 07/30/2022 2349   MCHC 32.8 07/30/2022 2349   RDW 13.5 07/30/2022 2349   LYMPHSABS 0.7 07/30/2022 2349   MONOABS 0.1 07/30/2022 2349   EOSABS 0.0 07/30/2022 2349   BASOSABS 0.0 07/30/2022 2349    BMET    Component Value Date/Time   NA 136 09/22/2023 1148   K 4.9 09/22/2023 1148   CL 99 09/22/2023 1148   CO2 22 09/22/2023 1148   GLUCOSE 148 (H) 09/22/2023 1148   GLUCOSE 265 (H)  07/30/2022 2349   BUN 10 09/22/2023 1148   CREATININE 0.83 09/22/2023 1148   CALCIUM 10.1 09/22/2023 1148   GFRNONAA >60 07/30/2022 2349    BNP No results found for: BNP   Imaging:  No results found.  Administration History     None           No data to display          No results found for: NITRICOXIDE      Assessment & Plan:   Excessive daytime sleepiness She has snoring, excessive daytime sleepiness, nocturnal apneic events, restless sleep. BMI 31. History of HTN, DM. Mallampati IV. Given this,  I am concerned she could have sleep disordered breathing with obstructive sleep apnea. She will need sleep study for further evaluation.    - discussed how weight can impact sleep and risk for sleep disordered breathing - discussed options to assist with weight loss: combination of diet modification, cardiovascular and strength training exercises   - had an extensive discussion regarding the adverse health consequences related to untreated sleep disordered breathing - specifically discussed the risks for hypertension, coronary artery disease, cardiac dysrhythmias, cerebrovascular disease, and diabetes - lifestyle modification discussed   - discussed how sleep disruption can increase risk of accidents,  particularly when driving - safe driving practices were discussed  Patient Instructions  Given your symptoms, I am concerned that you may have sleep disordered breathing with sleep apnea. You will need a sleep study for further evaluation. Someone will contact you to schedule this.   We discussed how untreated sleep apnea puts an individual at risk for cardiac arrhthymias, pulm HTN, DM, stroke and increases their risk for daytime accidents. We also briefly reviewed treatment options including weight loss, side sleeping position, oral appliance, CPAP therapy or referral to ENT for possible surgical options  Use caution when driving and pull over if you become sleepy.  Follow up in 6 weeks with Katie Ceci Taliaferro,NP to go over sleep study results, or sooner, if needed. Friday PM virtual clinic preferred       Loud snoring See above  Class 1 obesity with body mass index (BMI) of 31.0 to 31.9 in adult See above   Advised if symptoms do not improve or worsen, to please contact office for sooner follow up or seek emergency care.   I spent 35 minutes of dedicated to the care of this patient on the date of this encounter to include pre-visit review of records, face-to-face time with the patient discussing conditions above, post visit ordering of testing, clinical documentation with the electronic health record, making appropriate referrals as documented, and communicating necessary findings to members of the patients care team.  Comer LULLA Rouleau, NP 11/22/2023  Pt aware and understands NP's role.

## 2023-11-22 NOTE — Assessment & Plan Note (Signed)
 See above.

## 2023-11-22 NOTE — Patient Instructions (Signed)

## 2023-11-22 NOTE — Assessment & Plan Note (Signed)
 She has snoring, excessive daytime sleepiness, nocturnal apneic events, restless sleep. BMI 31. History of HTN, DM. Mallampati IV. Given this,  I am concerned she could have sleep disordered breathing with obstructive sleep apnea. She will need sleep study for further evaluation.    - discussed how weight can impact sleep and risk for sleep disordered breathing - discussed options to assist with weight loss: combination of diet modification, cardiovascular and strength training exercises   - had an extensive discussion regarding the adverse health consequences related to untreated sleep disordered breathing - specifically discussed the risks for hypertension, coronary artery disease, cardiac dysrhythmias, cerebrovascular disease, and diabetes - lifestyle modification discussed   - discussed how sleep disruption can increase risk of accidents, particularly when driving - safe driving practices were discussed  Patient Instructions  Given your symptoms, I am concerned that you may have sleep disordered breathing with sleep apnea. You will need a sleep study for further evaluation. Someone will contact you to schedule this.   We discussed how untreated sleep apnea puts an individual at risk for cardiac arrhthymias, pulm HTN, DM, stroke and increases their risk for daytime accidents. We also briefly reviewed treatment options including weight loss, side sleeping position, oral appliance, CPAP therapy or referral to ENT for possible surgical options  Use caution when driving and pull over if you become sleepy.  Follow up in 6 weeks with Lori Cayle Thunder,NP to go over sleep study results, or sooner, if needed. Friday PM virtual clinic preferred

## 2023-11-23 ENCOUNTER — Other Ambulatory Visit (HOSPITAL_COMMUNITY): Payer: Self-pay

## 2023-12-01 ENCOUNTER — Encounter

## 2023-12-01 DIAGNOSIS — G4719 Other hypersomnia: Secondary | ICD-10-CM

## 2023-12-01 DIAGNOSIS — R0683 Snoring: Secondary | ICD-10-CM

## 2023-12-01 DIAGNOSIS — G4733 Obstructive sleep apnea (adult) (pediatric): Secondary | ICD-10-CM | POA: Diagnosis not present

## 2023-12-12 DIAGNOSIS — G4733 Obstructive sleep apnea (adult) (pediatric): Secondary | ICD-10-CM | POA: Diagnosis not present

## 2023-12-13 ENCOUNTER — Ambulatory Visit: Payer: Self-pay | Admitting: Primary Care

## 2023-12-13 DIAGNOSIS — G4733 Obstructive sleep apnea (adult) (pediatric): Secondary | ICD-10-CM

## 2023-12-22 NOTE — Progress Notes (Signed)
 ATCx1, LVMTCB. Placed CPAP order.

## 2023-12-23 NOTE — Progress Notes (Signed)
 ATCx2, LVMTCB. Sending MyChart message as pt has been unable to be reached. Also informing pt via MyChart that she would need to reschedule her appt to a later date to have enough compliance data to review.

## 2023-12-23 NOTE — Telephone Encounter (Signed)
 Patient aware of HST results and ok with order being placed for cpap. Advised that once she received cpap to call office back to set up 6-8 week f/u. NFN

## 2024-01-02 ENCOUNTER — Telehealth: Payer: Self-pay | Admitting: Nurse Practitioner

## 2024-01-02 DIAGNOSIS — Z23 Encounter for immunization: Secondary | ICD-10-CM | POA: Diagnosis not present

## 2024-01-02 NOTE — Telephone Encounter (Signed)
 Copied from CRM [id number]. Topic: Clinical - Order For Equipment >> Jan 02, 2024 12:55 PM Devaughn RAMAN wrote: Reason for CRM: Patient called regarding CPAP machine. Patient stated she has not heard from the DME company regarding the CPAP machine, pt stated it has been going on for over a month.

## 2024-01-02 NOTE — Telephone Encounter (Signed)
 Called and spoke with patient gave patientr adapt number to call,order was received by them on 10/20

## 2024-01-04 ENCOUNTER — Ambulatory Visit

## 2024-01-16 ENCOUNTER — Encounter: Payer: Self-pay | Admitting: Podiatry

## 2024-01-16 ENCOUNTER — Ambulatory Visit (INDEPENDENT_AMBULATORY_CARE_PROVIDER_SITE_OTHER): Payer: Self-pay | Admitting: Podiatry

## 2024-01-16 DIAGNOSIS — B351 Tinea unguium: Secondary | ICD-10-CM | POA: Diagnosis not present

## 2024-01-16 DIAGNOSIS — M25572 Pain in left ankle and joints of left foot: Secondary | ICD-10-CM | POA: Diagnosis not present

## 2024-01-16 DIAGNOSIS — G8929 Other chronic pain: Secondary | ICD-10-CM | POA: Diagnosis not present

## 2024-01-16 NOTE — Progress Notes (Signed)
 Chief Complaint  Patient presents with   Nail Problem    Fungal toenails bilateral. Non diabetic. A1C 6.5.   Discussed the use of AI scribe software for clinical note transcription with the patient, who gave verbal consent to proceed.  History of Present Illness Lori Walsh is a 68 year old female who presents with nail fungus and left ankle pain.  She has nail fungus primarily affecting the big toenails. She previously used tea tree oil as a treatment but has not applied anything recently. She recalls having fungal involvement in the past and was prescribed a topical treatment by her dermatologist, although it has been a long time since she used it.  She has a history of recurrent ankle sprains, particularly affecting one ankle, resulting in persistent sensitivity. Recently, she experienced discomfort in the left ankle, raising concerns about a possible stress fracture. She has not been using any supportive devices like an ace wrap or ankle sleeve.  She is not limping at this time.  She walks approximately 6,000 steps daily, primarily through daily activities and yard work. She wants to maintain her activity level despite foot pain, which she attributes to her efforts to avoid developing diabetes. She mentions being on the verge of diabetes and notes the challenges of maintaining glucose tolerance post-menopause.  Her social history includes being active in yard maintenance, especially since her husband recently underwent eye surgery and is unable to perform tasks that might risk getting debris in his eyes.     Past Medical History:  Diagnosis Date   Depression    Fibroadenoma of right breast    GERD (gastroesophageal reflux disease)    History of gestational diabetes    History of nuclear stress test    04-16-2014  normal w/ no ischemia , ef 82% (in epic)   Hyperlipidemia    Hypertension    Left wrist fracture    Pre-diabetes    Thyroid  nodule    bilateral ---  previous bx , benign/   last ultrasound in epic 08-19-2017   TMJ syndrome    Past Surgical History:  Procedure Laterality Date   BREAST EXCISIONAL BIOPSY Right 1995   benign   COLONOSCOPY  last one 2007   COLONOSCOPY WITH PROPOFOL  N/A 01/22/2019   Procedure: COLONOSCOPY WITH PROPOFOL ;  Surgeon: Dianna Specking, MD;  Location: [facility];  Service: Endoscopy;  Laterality: N/A;   MICROTUBOPLASTY  1981   ORIF WRIST FRACTURE Left 10/10/2018   Procedure: OPEN REDUCTION INTERNAL FIXATION (ORIF) WRIST FRACTURE;  Surgeon: Beverley Evalene BIRCH, MD;  Location: [facility];  Service: Orthopedics;  Laterality: Left;   TUBAL LIGATION Bilateral 1987   Allergies  Allergen Reactions   Strawberry Extract Itching   Crestor [Rosuvastatin] Other (See Comments)    MUSCLE ACHES    Lipitor [Atorvastatin] Other (See Comments)    MUSCLE ACHES   Zocor [Simvastatin] Other (See Comments)    MUSCLE ACHES     Physical Exam EXTREMITIES: Palpable pedal pulses bilateral.  No significant edema is appreciated.  The hallux nail bilateral shows some yellow discoloration, 3 mm thickening, distal onycholysis and pain with compression along the distal 20% of the toenails.  The proximal portion does appear clear.  Right 2nd and 3rd toenails also show some changes which could be consistent with onychomycosis as well.  No skin changes consistent with tinea pedis were identified.  Epicritic sensation intact.  There does appear to be some mild localized swelling to the medial aspect  of the left ankle.   Results Procedure: Nail Clipping for Fungal Culture Description: Clippings taken from the distal portion of the toenail with sterile nail nippers and a power debriding bur.    Assessment/Plan of Care: 1. Fungal nail infection   2. Chronic pain of left ankle    Assessment & Plan Onychomycosis (nail fungus) Fungal involvement on the big toenails, primarily at the front. Differential diagnosis includes  dermatophyte, saprophyte, or yeast infection. Previous use of tea tree oil and topical antifungal treatment by dermatologist. Need to confirm diagnosis to ensure appropriate treatment. - Took nail clippings for laboratory analysis to identify the organism.  Specimen sent to [facility] for fungal nail culture - Will await laboratory results to determine the appropriate treatment. - Will schedule follow-up in three months to assess treatment efficacy.  Ankle pain with possible chronic instability Chronic ankle pain with multiple previous sprains. Recent discomfort in the ankle, raising concern for possible stress fracture. Differential diagnosis includes stress fracture or other age-related changes. - Ordered x-ray of the ankle to rule out stress fracture.  Will reevaluate in 1 to 2 weeks - Recommended use of an ace wrap or ankle sleeve for support until she can be formally evaluated with x-rays of the left ankle.   Awanda CHARM Imperial, DPM, FACFAS [facility]     [address] [place], [place] [address]                Office [phone]  Fax [phone]

## 2024-01-18 ENCOUNTER — Ambulatory Visit: Admitting: Nurse Practitioner

## 2024-01-23 ENCOUNTER — Encounter: Payer: Self-pay | Admitting: Podiatry

## 2024-01-23 ENCOUNTER — Ambulatory Visit (INDEPENDENT_AMBULATORY_CARE_PROVIDER_SITE_OTHER): Admitting: Podiatry

## 2024-01-23 ENCOUNTER — Ambulatory Visit (INDEPENDENT_AMBULATORY_CARE_PROVIDER_SITE_OTHER)

## 2024-01-23 DIAGNOSIS — M76822 Posterior tibial tendinitis, left leg: Secondary | ICD-10-CM | POA: Diagnosis not present

## 2024-01-23 DIAGNOSIS — M25572 Pain in left ankle and joints of left foot: Secondary | ICD-10-CM

## 2024-01-23 DIAGNOSIS — E1142 Type 2 diabetes mellitus with diabetic polyneuropathy: Secondary | ICD-10-CM | POA: Diagnosis not present

## 2024-01-23 DIAGNOSIS — G8929 Other chronic pain: Secondary | ICD-10-CM

## 2024-01-23 NOTE — Progress Notes (Unsigned)
 Chief Complaint  Patient presents with   Foot Pain    F/U left foot medial ankle pain. 1 pain. Non diabetic.   Discussed the use of AI scribe software for clinical note transcription with the patient, who gave verbal consent to proceed.  History of Present Illness Lori Walsh Lori Walsh is a 68 year old female with plantar fasciitis who presents with foot pain and alignment issues.  She experiences intermittent shooting pain primarily on the medial aspect of her foot, accompanied by a sensation of a tendon or structure 'popping' and misaligning, which contributes to her discomfort. The pain is less severe today but can be exacerbated by certain activities.  She enjoys going barefoot in the house, but has noticed that this can lead to inflammation in her foot.  She has a history of back issues that have affected her ability to engage in activities like hiking, which she previously enjoyed. She wants to return to longer hikes, such as five-mile hikes, which she finds enjoyable.  She has been using shoes with some support but finds that dress shoes do not provide adequate support. She has not used diabetic shoes or custom orthotics before but is interested in preventative measures to maintain her mobility and health as she ages.    Past Medical History:  Diagnosis Date   Depression    Fibroadenoma of right breast    GERD (gastroesophageal reflux disease)    History of gestational diabetes    History of nuclear stress test    04-16-2014  normal w/ no ischemia , ef 82% (in epic)   Hyperlipidemia    Hypertension    Left wrist fracture    Pre-diabetes    Thyroid  nodule    bilateral --- previous bx , benign/   last ultrasound in epic 08-19-2017   TMJ syndrome    Past Surgical History:  Procedure Laterality Date   BREAST EXCISIONAL BIOPSY Right 1995   benign   COLONOSCOPY  last one 2007   COLONOSCOPY WITH PROPOFOL  N/A 01/22/2019   Procedure: COLONOSCOPY WITH PROPOFOL ;  Surgeon:  Dianna Specking, MD;  Location: [facility];  Service: Endoscopy;  Laterality: N/A;   MICROTUBOPLASTY  1981   ORIF WRIST FRACTURE Left 10/10/2018   Procedure: OPEN REDUCTION INTERNAL FIXATION (ORIF) WRIST FRACTURE;  Surgeon: Beverley Evalene BIRCH, MD;  Location: [facility];  Service: Orthopedics;  Laterality: Left;   TUBAL LIGATION Bilateral 1987   Allergies  Allergen Reactions   Strawberry Extract Itching   Crestor [Rosuvastatin] Other (See Comments)    MUSCLE ACHES    Lipitor [Atorvastatin] Other (See Comments)    MUSCLE ACHES   Zocor [Simvastatin] Other (See Comments)    MUSCLE ACHES    Physical Exam Ankle joint normal. Mild tenderness on palpation of foot along the posterior tibial tendon / medial ankle. Navicular bone prominence noted medially.  No erythema or ecchymosis noted.  Palpable pedal pulses noted.  No open lesions.  No pain with ROM of ankle, no restrictions on ROM.  Decrease in vibratory sensation, but protective sensation intact with Semmes-Weinstein monofilament.   Results RADIOLOGY (Left ankle, 2WB views, 01/23/24) Ankle X-ray: Normal ankle joint with no joint space narrowing or osteochondral fragments.  Normal mineralization.    Assessment/Plan of Care: 1. Chronic pain of left ankle   2. Type 2 diabetes mellitus with peripheral neuropathy (HCC)   3. Posterior tibial tendon dysfunction (PTTD) of left lower extremity     Assessment & Plan Chronic left ankle and  foot pain / PTTD left rearfoot Intermittent shooting pain in the left ankle, particularly on the medial side, with occasional tendon popping. Prominent navicular bone causing biomechanical disadvantage. Pain exacerbated by inadequate footwear support. No significant joint space narrowing or loose bone fragments on x-ray. - Recommended anti-inflammatories for pain management. - Suggested use of Voltaren  gel or periodic cortisone injections for severe pain. - Advised against frequent cortisone  injections unless pain is severe.  Patien - Recommended custom orthotics to improve alignment and reduce pain. - Discussed potential use of an adjustable ankle brace (SpeedPro Brace) for longer hikes if needed.  Type 2 diabetes mellitus with diabetic polyneuropathy Diabetic polyneuropathy with emphasis on preventing foot ulcers and amputations. Insurance coverage for diabetic shoes and custom inserts available through the diabetic shoe program.  Discussed how DM neuropathy can be a chronic progressive deformity.  Emphasis on good blood sugar control and physical activity.  - Referred to [employer] for diabetic shoes and custom inserts. - Completed the 3 pages of required diabetic shoe paperwork for diabetic shoe program and dispensed to patient.  Plantar fasciitis Likely exacerbated by inadequate footwear support and going barefoot. Symptoms managed by avoiding barefoot walking and using supportive footwear. - Advised wearing supportive footwear to prevent inflammation. - Recommended custom orthotics for additional arch support.    F/u prn    Awanda CHARM Imperial, DPM, FACFAS [facility]     [address] [place], [place] [address]                Office [phone]  Fax [phone]

## 2024-01-25 DIAGNOSIS — Z23 Encounter for immunization: Secondary | ICD-10-CM | POA: Diagnosis not present

## 2024-01-26 ENCOUNTER — Ambulatory Visit: Payer: Self-pay | Admitting: Podiatry

## 2024-01-26 DIAGNOSIS — B351 Tinea unguium: Secondary | ICD-10-CM

## 2024-01-26 NOTE — Progress Notes (Signed)
 LMOM for patient to check her my chart messages, for detailed results about her fungal nail culture.  Lori Walsh

## 2024-01-30 ENCOUNTER — Telehealth: Payer: Self-pay | Admitting: Family Medicine

## 2024-01-30 ENCOUNTER — Encounter: Payer: Self-pay | Admitting: Family Medicine

## 2024-01-30 ENCOUNTER — Ambulatory Visit: Admitting: Family Medicine

## 2024-01-30 ENCOUNTER — Other Ambulatory Visit (HOSPITAL_COMMUNITY): Payer: Self-pay

## 2024-01-30 ENCOUNTER — Other Ambulatory Visit: Payer: Self-pay

## 2024-01-30 VITALS — BP 138/66 | HR 77 | Ht 62.0 in | Wt 168.8 lb

## 2024-01-30 DIAGNOSIS — F419 Anxiety disorder, unspecified: Secondary | ICD-10-CM | POA: Diagnosis not present

## 2024-01-30 DIAGNOSIS — F339 Major depressive disorder, recurrent, unspecified: Secondary | ICD-10-CM

## 2024-01-30 DIAGNOSIS — I10 Essential (primary) hypertension: Secondary | ICD-10-CM | POA: Diagnosis not present

## 2024-01-30 DIAGNOSIS — E1169 Type 2 diabetes mellitus with other specified complication: Secondary | ICD-10-CM | POA: Diagnosis not present

## 2024-01-30 DIAGNOSIS — M2142 Flat foot [pes planus] (acquired), left foot: Secondary | ICD-10-CM

## 2024-01-30 DIAGNOSIS — E119 Type 2 diabetes mellitus without complications: Secondary | ICD-10-CM

## 2024-01-30 DIAGNOSIS — E785 Hyperlipidemia, unspecified: Secondary | ICD-10-CM | POA: Diagnosis not present

## 2024-01-30 DIAGNOSIS — F5104 Psychophysiologic insomnia: Secondary | ICD-10-CM

## 2024-01-30 LAB — POCT GLYCOSYLATED HEMOGLOBIN (HGB A1C): HbA1c, POC (controlled diabetic range): 6.1 % (ref 0.0–7.0)

## 2024-01-30 MED ORDER — FLUOXETINE HCL 20 MG PO CAPS
20.0000 mg | ORAL_CAPSULE | Freq: Every day | ORAL | 1 refills | Status: AC
  Filled 2024-01-30: qty 30, 30d supply, fill #0

## 2024-01-30 MED ORDER — HYDROXYZINE HCL 10 MG PO TABS
10.0000 mg | ORAL_TABLET | Freq: Three times a day (TID) | ORAL | 3 refills | Status: AC | PRN
Start: 2024-01-30 — End: ?
  Filled 2024-01-30: qty 90, 30d supply, fill #0

## 2024-01-30 NOTE — Assessment & Plan Note (Signed)
 Recent A1c improvement to 6.1%, indicating prediabetes. Weight loss contributing to glycemic control. - Continue current diabetes management plan. - Encouraged continued weight management and lifestyle modifications.

## 2024-01-30 NOTE — Patient Instructions (Addendum)
 It was wonderful to see you today.  Please bring ALL of your medications with you to every visit.   Today we talked about:  - we added hydroxyzine  10 mg as needed for sleep or anxiety.   - We have increased your fluoxetine  to 60 mg daily, add an additional 20 mg tablet to your 40 mg daily.  - For information on therapists, please go to [email or web address]. You can also contact your insurance company to find an hospital doctor.   Thank you for choosing [facility].   Please call [phone] with any questions about today's appointment.  Please arrive at least 15 minutes prior to your scheduled appointments.   If you had blood work today, I will send you a MyChart message or a letter if results are normal. Otherwise, I will give you a call.   If you had a referral placed, they will call you to set up an appointment. Please give us  a call if you don't hear back in the next 2 weeks.   If you need additional refills before your next appointment, please call your pharmacy first.  Don't forget to check out the [facility] in [facility] at [address] [phone] Affordable prices on prescriptions and over-the-counter items, as well as services like vaccinations and medication home delivery.   Rollene Keeling, MD  Family Medicine

## 2024-01-30 NOTE — Assessment & Plan Note (Signed)
 Hypertension managed with lisinopril . Slightly elevated blood pressure likely due to stress. Home monitoring shows occasional high readings. - Continue lisinopril  40 mg daily. - Monitor blood pressure at home and report consistently high readings over 140/90 mmHg.

## 2024-01-30 NOTE — Progress Notes (Signed)
    SUBJECTIVE:   CHIEF COMPLAINT / HPI:   Discussed the use of AI scribe software for clinical note transcription with the patient, who gave verbal consent to proceed.  History of Present Illness Lori Walsh is a 68 year old female who presents for management of anxiety and sleep disturbances.  Anxiety and emotional distress - Anxiety and emotional distress are exacerbated by family stressors, including financial responsibilities for her niece and her son with HIV. - Frequent arguments and lack of support from her husband contribute to emotional distress. - Feels overwhelmed and has thoughts of 'running away,' but no current thoughts of self-harm. Denies SI - Receives weekly support from a [employer]. - Supportive church community present. - No physical safety concerns at home. - Takes 40 mg of Prozac  daily, which provides partial relief of anxiety and depression. - Continues to feel overwhelmed despite medication.  Sleep disturbances - Difficulty falling asleep due to racing thoughts at night. - Frequent nighttime awakenings and restlessness despite using a CPAP machine. - Occasionally uses Tylenol  PM or Benadryl to aid sleep.  Plantar fascities - History of plantar fasciitis and collapsed arch causing discomfort, has form to sign for shoes  Diet controlled T2DM - Recent weight loss has improved blood sugar levels.    PERTINENT  PMH / PSH: T2DM diet controlled, MDD  OBJECTIVE:   BP 138/66   Pulse 77   Ht 5' 2 (1.575 m)   Wt 168 lb 12.8 oz (76.6 kg)   LMP 03/08/2009 (Approximate)   SpO2 99%   BMI 30.87 kg/m   Physical Exam VITALS: BP- 138/66 General: alert & oriented, no apparent distress, well groomed HEENT: normocephalic, atraumatic, EOM grossly intact, oral mucosa moist, neck supple Respiratory: normal respiratory effort GI: non-distended Skin: no rashes, no jaundice Psych: appropriate mood and affect Left foot: collapsed arch, normal cap  refill and 2+ DP and PT pulses, warm.  ASSESSMENT/PLAN:   Assessment & Plan Controlled type 2 diabetes mellitus without complication, without long-term current use of insulin (HCC) Recent A1c improvement to 6.1%, indicating prediabetes. Weight loss contributing to glycemic control. - Continue current diabetes management plan. - Encouraged continued weight management and lifestyle modifications. Anxiety Increase fluoxetine  to 60 mg daily - Prescribed hydroxyzine  for anxiety and sleep, up to three times daily as needed. - Encouraged engagement with [employer] and therapy options. Recurrent major depressive disorder, remission status unspecified Prozac  as above, given psychology today resources Psychophysiological insomnia - Prescribed hydroxyzine  for sleep, to be taken at night as needed. Pes planus of left foot Filled out form for shoe with inserts due to foot deformity - Recommended supportive footwear and orthotics. Primary hypertension Hypertension managed with lisinopril . Slightly elevated blood pressure likely due to stress. Home monitoring shows occasional high readings. - Continue lisinopril  40 mg daily. - Monitor blood pressure at home and report consistently high readings over 140/90 mmHg. Hyperlipidemia associated with type 2 diabetes mellitus (HCC) Associated with type 2 diabetes mellitus. Due for cholesterol check. - Ordered cholesterol panel.   Lori FORBES Keeling, MD [facility]

## 2024-01-30 NOTE — Assessment & Plan Note (Signed)
 Prozac  as above, given psychology today resources

## 2024-01-31 ENCOUNTER — Ambulatory Visit: Payer: Self-pay | Admitting: Family Medicine

## 2024-01-31 LAB — COMPREHENSIVE METABOLIC PANEL WITH GFR
ALT: 15 IU/L (ref 0–32)
AST: 19 IU/L (ref 0–40)
Albumin: 4.2 g/dL (ref 3.9–4.9)
Alkaline Phosphatase: 72 IU/L (ref 49–135)
BUN/Creatinine Ratio: 17 (ref 12–28)
BUN: 13 mg/dL (ref 8–27)
Bilirubin Total: 0.3 mg/dL (ref 0.0–1.2)
CO2: 22 mmol/L (ref 20–29)
Calcium: 10 mg/dL (ref 8.7–10.3)
Chloride: 101 mmol/L (ref 96–106)
Creatinine, Ser: 0.75 mg/dL (ref 0.57–1.00)
Globulin, Total: 2.2 g/dL (ref 1.5–4.5)
Glucose: 113 mg/dL — ABNORMAL HIGH (ref 70–99)
Potassium: 4.3 mmol/L (ref 3.5–5.2)
Sodium: 139 mmol/L (ref 134–144)
Total Protein: 6.4 g/dL (ref 6.0–8.5)
eGFR: 86 mL/min/1.73 (ref >59)

## 2024-01-31 LAB — LIPID PANEL
Chol/HDL Ratio: 4.1 ratio (ref 0.0–4.4)
Cholesterol, Total: 160 mg/dL (ref 100–199)
HDL: 39 mg/dL — ABNORMAL LOW (ref >39)
LDL Chol Calc (NIH): 96 mg/dL (ref 0–99)
Triglycerides: 139 mg/dL (ref 0–149)
VLDL Cholesterol Cal: 25 mg/dL (ref 5–40)

## 2024-02-01 ENCOUNTER — Other Ambulatory Visit (HOSPITAL_COMMUNITY): Payer: Self-pay

## 2024-02-01 MED ORDER — EZETIMIBE 10 MG PO TABS
10.0000 mg | ORAL_TABLET | Freq: Every day | ORAL | 3 refills | Status: AC
  Filled 2024-02-01 – 2024-02-20 (×2): qty 90, 90d supply, fill #0

## 2024-02-09 ENCOUNTER — Other Ambulatory Visit (HOSPITAL_COMMUNITY): Payer: Self-pay

## 2024-02-10 ENCOUNTER — Other Ambulatory Visit (HOSPITAL_COMMUNITY): Payer: Self-pay

## 2024-02-15 ENCOUNTER — Encounter: Payer: Self-pay | Admitting: Nurse Practitioner

## 2024-02-15 ENCOUNTER — Ambulatory Visit (INDEPENDENT_AMBULATORY_CARE_PROVIDER_SITE_OTHER): Admitting: Nurse Practitioner

## 2024-02-15 VITALS — BP 160/78 | HR 79 | Temp 97.6°F | Ht 63.0 in | Wt 171.4 lb

## 2024-02-15 DIAGNOSIS — G4733 Obstructive sleep apnea (adult) (pediatric): Secondary | ICD-10-CM | POA: Insufficient documentation

## 2024-02-15 NOTE — Assessment & Plan Note (Signed)
 Severe OSA. Recently started on CPAP. Excellent compliance and control. She is having some moderate leaks, leading to sleep disruption. Will decrease her maximum pressure to 12 cmH2O. Encouraged to attend mask fitting. Aware of proper care/use of device. Understands risks of untreated severe OSA. Healthy weight management encouraged. Safe driving practices reviewed.  Patient Instructions  Continue to use CPAP every night, minimum of 4-6 hours a night.  Change equipment as directed. Wash your tubing with warm soap and water daily, hang to dry. Wash humidifier portion weekly. Use bottled, distilled water and change daily Be aware of reduced alertness and do not drive or operate heavy machinery if experiencing this or drowsiness.  Exercise encouraged, as tolerated. Healthy weight management discussed.  Avoid or decrease alcohol consumption and medications that make you more sleepy, if possible. Notify if persistent daytime sleepiness occurs even with consistent use of PAP therapy.  Change CPAP supplies... Every month Mask cushions and/or nasal pillows CPAP machine filters Every 3 months Mask frame (not including the headgear) CPAP tubing Every 6 months Mask headgear Chin strap (if applicable) Humidifier water tub   Adjust CPAP settings to 5-12 cmH2O  Follow up 1/9 at 1:30 pm with Katie Marshall Kampf,NP via virtual visit, or sooner if needed

## 2024-02-15 NOTE — Patient Instructions (Addendum)
 Continue to use CPAP every night, minimum of 4-6 hours a night.  Change equipment as directed. Wash your tubing with warm soap and water daily, hang to dry. Wash humidifier portion weekly. Use bottled, distilled water and change daily Be aware of reduced alertness and do not drive or operate heavy machinery if experiencing this or drowsiness.  Exercise encouraged, as tolerated. Healthy weight management discussed.  Avoid or decrease alcohol consumption and medications that make you more sleepy, if possible. Notify if persistent daytime sleepiness occurs even with consistent use of PAP therapy.  Change CPAP supplies... Every month Mask cushions and/or nasal pillows CPAP machine filters Every 3 months Mask frame (not including the headgear) CPAP tubing Every 6 months Mask headgear Chin strap (if applicable) Humidifier water tub   Adjust CPAP settings to 5-12 cmH2O  Follow up 1/9 at 1:30 pm with Katie Prakriti Carignan,NP via virtual visit, or sooner if needed

## 2024-02-15 NOTE — Progress Notes (Signed)
 @Patient  ID: Lori Walsh, female    DOB: Jul 31, 1955, 68 y.o.   MRN: [id number]  Chief Complaint  Patient presents with   Medical Management of Chronic Issues    HST F/u    Referring provider: Donzetta Rollene BRAVO, MD  HPI: 68 year old female, never smoker referred for sleep consult. Past medical history significant for HTN, DM, depression, cardiac murmur.  TEST/EVENTS:  12/01/2023 HST: AHI 32/h  11/22/2023: OV with Malachy NP Lori Walsh is a 69 year old female who presents for a sleep consult due to loud snoring and gasping during sleep. She experiences loud snoring and gasping during sleep, as noted by her family. She does not wake up choking or gasping. She engages in sleep talking but not sleep walking. Daytime fatigue is prominent, with frequent naps. No morning headaches, drowsy driving, or falling asleep while driving. A sleep study was conducted approximately 20 years ago, but she has never been on CPAP therapy. She perceives her symptoms have worsened over time. Her husband's sleep issues and her's have lead them to sleep in separate rooms. Her children have expressed concern about her snoring, particularly during a recent beach trip. She has attempted to use a SnoreGuard, which she feels helps, but she cannot keep it in her mouth all night. She has not used any sleep medications consistently but has taken Nyquil recently due to post-COVID drainage. She previously used gabapentin  for a hip issue, which she has since discontinued after chiropractic relief. 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 She's retired now. She struggles with weight loss. She does not want to do any medications at this time. She consumes wine occasionally and drinks two cups of coffee in the morning.  She has a history of terrible tonsils as a child, which were never removed. She goes to bed around [phone] pm.  Falls asleep typically within 45 min. Wakes 3 times a night. Gets up around 8 am. No significant weight change in last few years.  Ep[place] 3  02/15/2024: Today - follow up Discussed the use of AI scribe software for clinical note transcription with the patient, who gave verbal consent to proceed.  History of Present Illness  Lori Walsh is a 68 year old female with sleep apnea who presents with issues related to CPAP therapy.  She is experiencing air leaks from her CPAP machine, which are causing her to wake up at night. These disruptions are affecting her sleep quality and leading to increased fatigue.  She has been using the CPAP machine for less than thirty days. Initially, she felt significantly more rested after the first week of use. She has a mask fitting scheduled for later today. She does feel like her sleep is more restful with it than without it. No drowsy driving.     Allergies  Allergen Reactions   Strawberry Extract Itching   Crestor [Rosuvastatin] Other (See Comments)    MUSCLE ACHES    Lipitor [Atorvastatin] Other (See Comments)    MUSCLE ACHES   Zocor [Simvastatin] Other (See Comments)    MUSCLE ACHES     Immunization History  Administered Date(s) Administered   INFLUENZA, HIGH DOSE SEASONAL PF 01/02/2024   Influenza Split 12/07/2018   Influenza-Unspecified 12/26/2018   PFIZER(Purple Top)SARS-COV-2 Vaccination 04/04/2019, 04/25/2019   Pfizer(Comirnaty)Fall Seasonal Vaccine 12 years and older 01/25/2024   Tdap 01/25/2024   Zoster, Live 08/04/2018  Past Medical History:  Diagnosis Date   Depression    Fibroadenoma of right breast    GERD (gastroesophageal reflux disease)    History of gestational diabetes    History of nuclear stress test    04-16-2014  normal w/ no ischemia , ef 82% (in epic)   Hyperlipidemia    Hypertension    Left wrist fracture    Pre-diabetes    Thyroid  nodule    bilateral --- previous bx , benign/   last  ultrasound in epic 08-19-2017   TMJ syndrome     Tobacco History: Social History   Tobacco Use  Smoking Status Never  Smokeless Tobacco Never   Counseling given: Not Answered   Outpatient Medications Prior to Visit  Medication Sig Dispense Refill   Accu-Chek Softclix Lancets lancets Use as directed once daily 100 each 4   acyclovir  ointment (ZOVIRAX ) 5 % Apply 1 Application topically every 4 (four) hours as needed. 30 g 2   aspirin EC 81 MG tablet Take 81 mg by mouth at bedtime.      Blood Glucose Monitoring Suppl (ACCU-CHEK GUIDE) w/Device KIT Use as directed to check glucose once daily 1 kit 0   calcium carbonate (TUMS - DOSED IN MG ELEMENTAL CALCIUM) 500 MG chewable tablet Chew 1 tablet by mouth as needed for indigestion or heartburn.     ezetimibe  (ZETIA ) 10 MG tablet Take 1 tablet (10 mg total) by mouth daily. 90 tablet 3   fenofibrate  160 MG tablet Take 1 tablet (160 mg total) by mouth daily. 90 tablet 3   FLUoxetine  (PROZAC ) 40 MG capsule Take 1 capsule (40 mg total) by mouth daily. 90 capsule 3   fluticasone  (FLONASE ) 50 MCG/ACT nasal spray Place 2 sprays into both nostrils 2 (two) times daily. 15.8 g 0   glucose blood (ACCU-CHEK GUIDE) test strip CHeck glucose once daily 100 strip 4   hydrOXYzine  (ATARAX ) 10 MG tablet Take 1 tablet (10 mg total) by mouth 3 (three) times daily as needed. 90 tablet 3   lisinopril  (ZESTRIL ) 40 MG tablet Take 1 tablet (40 mg total) by mouth daily. 90 tablet 3   Multiple Vitamin (MULTIVITAMIN) tablet Take 1 tablet by mouth daily.     trolamine salicylate (ASPERCREME) 10 % cream Apply 1 application topically as needed for muscle pain (hip).     FLUoxetine  (PROZAC ) 20 MG capsule Take 1 capsule (20 mg total) by mouth daily. Take with your 40 mg daily capsule for a total of 60 mg daily. (Patient not taking: Reported on 02/15/2024) 30 capsule 1   Lancets (FREESTYLE) lancets      lansoprazole (PREVACID) 15 MG capsule Take 15 mg by mouth daily at 12  noon.     No facility-administered medications prior to visit.     Review of Systems: As above     Physical Exam:  BP (!) 160/78 Comment: rechecked due to elevated BP at initial reading  Pulse 79   Temp 97.6 F (36.4 C)   Ht 5' 3 (1.6 m) Comment: Per pt  Wt 171 lb 6.4 oz (77.7 kg)   LMP 03/08/2009 (Approximate)   SpO2 94% Comment: RA  BMI 30.36 kg/m   GEN: Pleasant, interactive, well-appearing; obese; in no acute distress HEENT:  Normocephalic and atraumatic. PERRLA. Sclera white. Nasal turbinates pink, moist and patent bilaterally. No rhinorrhea present. Oropharynx pink and moist, without exudate or edema. No lesions, ulcerations, or postnasal drip. Mallampati IV NECK:  Supple w/ fair ROM. No thyroidmegaly.  No lymphadenopathy.   CV: RRR, no m/r/g PULMONARY:  Unlabored, regular breathing. Clear bilaterally A&P w/o wheezes/rales/rhonchi.  GI: BS present and normoactive. Soft, non-tender to palpation. No organomegaly or masses detected.  MSK: No erythema, warmth or tenderness. Cap refil <2 sec all extrem.  Neuro: A/Ox3. No focal deficits noted.   Skin: Warm, no lesions or rashe Psych: Normal affect and behavior. Judgement and thought content appropriate.     Lab Results:  CBC    Component Value Date/Time   WBC 11.0 (H) 07/30/2022 2349   RBC 4.41 07/30/2022 2349   HGB 13.1 07/30/2022 2349   HCT 40.0 07/30/2022 2349   PLT 264 07/30/2022 2349   MCV 90.7 07/30/2022 2349   MCH 29.7 07/30/2022 2349   MCHC 32.8 07/30/2022 2349   RDW 13.5 07/30/2022 2349   LYMPHSABS 0.7 07/30/2022 2349   MONOABS 0.1 07/30/2022 2349   EOSABS 0.0 07/30/2022 2349   BASOSABS 0.0 07/30/2022 2349    BMET    Component Value Date/Time   NA 139 01/30/2024 0950   K 4.3 01/30/2024 0950   CL 101 01/30/2024 0950   CO2 22 01/30/2024 0950   GLUCOSE 113 (H) 01/30/2024 0950   GLUCOSE 265 (H) 07/30/2022 2349   BUN 13 01/30/2024 0950   CREATININE 0.75 01/30/2024 0950   CALCIUM 10.0 01/30/2024  0950   GFRNONAA >60 07/30/2022 2349    BNP No results found for: BNP   Imaging:  DG Ankle 2 Views Left Result Date: 01/23/2024 Please see detailed radiograph report in office note.   Administration History     None           No data to display          No results found for: NITRICOXIDE      Assessment & Plan:   OSA on CPAP Severe OSA. Recently started on CPAP. Excellent compliance and control. She is having some moderate leaks, leading to sleep disruption. Will decrease her maximum pressure to 12 cmH2O. Encouraged to attend mask fitting. Aware of proper care/use of device. Understands risks of untreated severe OSA. Healthy weight management encouraged. Safe driving practices reviewed.  Patient Instructions  Continue to use CPAP every night, minimum of 4-6 hours a night.  Change equipment as directed. Wash your tubing with warm soap and water daily, hang to dry. Wash humidifier portion weekly. Use bottled, distilled water and change daily Be aware of reduced alertness and do not drive or operate heavy machinery if experiencing this or drowsiness.  Exercise encouraged, as tolerated. Healthy weight management discussed.  Avoid or decrease alcohol consumption and medications that make you more sleepy, if possible. Notify if persistent daytime sleepiness occurs even with consistent use of PAP therapy.  Change CPAP supplies... Every month Mask cushions and/or nasal pillows CPAP machine filters Every 3 months Mask frame (not including the headgear) CPAP tubing Every 6 months Mask headgear Chin strap (if applicable) Humidifier water tub   Adjust CPAP settings to 5-12 cmH2O  Follow up 1/9 at 1:30 pm with Katie [place],NP via virtual visit, or sooner if needed     Advised if symptoms do not improve or worsen, to please contact office for sooner follow up or seek emergency care.   I spent 35 minutes of dedicated to the care of this patient on the date of  this encounter to include pre-visit review of records, face-to-face time with the patient discussing conditions above, post visit ordering of testing, clinical documentation with the electronic health  record, making appropriate referrals as documented, and communicating necessary findings to members of the patients care team.  Comer LULLA Rouleau, NP 02/15/2024  Pt aware and understands NP's role.

## 2024-02-20 ENCOUNTER — Other Ambulatory Visit (HOSPITAL_COMMUNITY): Payer: Self-pay

## 2024-03-07 ENCOUNTER — Ambulatory Visit (INDEPENDENT_AMBULATORY_CARE_PROVIDER_SITE_OTHER): Admitting: Family Medicine

## 2024-03-07 ENCOUNTER — Encounter: Payer: Self-pay | Admitting: Family Medicine

## 2024-03-07 VITALS — BP 138/80 | HR 83 | Temp 99.7°F | Ht 62.5 in | Wt 168.0 lb

## 2024-03-07 DIAGNOSIS — J189 Pneumonia, unspecified organism: Secondary | ICD-10-CM | POA: Diagnosis present

## 2024-03-07 MED ORDER — AZITHROMYCIN 250 MG PO TABS: ORAL_TABLET | ORAL | 0 refills | Status: AC

## 2024-03-07 MED ORDER — OSELTAMIVIR PHOSPHATE 75 MG PO CAPS: 75.0000 mg | ORAL_CAPSULE | Freq: Two times a day (BID) | ORAL | 0 refills | Status: AC

## 2024-03-07 MED ORDER — AMOXICILLIN-POT CLAVULANATE 875-125 MG PO TABS: 1.0000 | ORAL_TABLET | Freq: Two times a day (BID) | ORAL | 0 refills | Status: AC

## 2024-03-07 NOTE — Patient Instructions (Signed)
 It was great to see you!  Our plans for today:  - Take the antibiotics and antiviral for 5 days.  - If you develop difficulty breathing, chest pain, or inability to keep down fluids, go to the ED.  Take care and seek immediate care sooner if you develop any concerns.   Dr. Lanessa Shill

## 2024-03-07 NOTE — Assessment & Plan Note (Signed)
 Likely superimposed from influenza-like illness. No flu testing done given high pretest probability.  Negative COVID test. Rhonchi in right lower lung supports pneumonia diagnosis. Within antiviral treatment window. - Prescribed Tamiflu. - Prescribed antibiotics. - Advised isolation, hand hygiene, and mask use.

## 2024-03-07 NOTE — Progress Notes (Signed)
" ° ° °  SUBJECTIVE:   CHIEF COMPLAINT / HPI:   Discussed the use of AI scribe software for clinical note transcription with the patient, who gave verbal consent to proceed.  History of Present Illness Lori Walsh is a 68 year old female who presents with cough, fever, and headache.  URI symptoms - Fever to 101-102F since Sunday - Chills present - Poor sleep due to severe coughing and wheezing - Productive cough with green sputum - Wheezing - Severe coughing interfering with sleep - Unable to use CPAP due to congestion and heavy mucus - No history of COPD or asthma - Severe headache, possibly sinus-related - Nausea without vomiting or diarrhea - Recent close contact with sick family members, unknown flu exposure - Home COVID test negative - Diabetes present, well controlled - Partial relief with Nyquil    OBJECTIVE:   BP 138/80   Pulse 83   Temp 99.7 F (37.6 C) Comment: Acetaminophen  around 0930.  Ht 5' 2.5 (1.588 m)   Wt 168 lb (76.2 kg)   LMP 03/08/2009   SpO2 97%   BMI 30.24 kg/m   Gen: well appearing, in NAD Card: RRR Lungs: CTAB, slight expiratory rhonchi throughout, worse in bases with crackles and rhonchi in RLL Ext: WWP  ASSESSMENT/PLAN:   CAP (community acquired pneumonia) Likely superimposed from influenza-like illness. No flu testing done given high pretest probability.  Negative COVID test. Rhonchi in right lower lung supports pneumonia diagnosis. Within antiviral treatment window. - Prescribed Tamiflu. - Prescribed antibiotics. - Advised isolation, hand hygiene, and mask use.     Donald CHRISTELLA Lai, DO "

## 2024-03-16 ENCOUNTER — Telehealth: Admitting: Nurse Practitioner

## 2024-03-16 ENCOUNTER — Encounter: Payer: Self-pay | Admitting: Nurse Practitioner

## 2024-03-16 VITALS — Ht 62.5 in | Wt 165.0 lb

## 2024-03-16 DIAGNOSIS — J189 Pneumonia, unspecified organism: Secondary | ICD-10-CM

## 2024-03-16 DIAGNOSIS — G4733 Obstructive sleep apnea (adult) (pediatric): Secondary | ICD-10-CM

## 2024-03-16 NOTE — Assessment & Plan Note (Addendum)
 Severe OSA, on CPAP. Suboptimal compliance due to recent illness. Since resumed therapy. Improved tolerance with pressure changes from 5-20 to 5-12 cmH2O. Reduced leaks. Receives benefit from use. Encouraged to work on increasing usage and notify of any further issues. Aware of proper care/use of device. Understands risks of untreated severe OSA. Healthy weight management encouraged. Safe driving practices reviewed.  Patient Instructions  Continue to increase use of CPAP every night, minimum of 4-6 hours a night.  Change equipment as directed. Wash your tubing with warm soap and water daily, hang to dry. Wash humidifier portion weekly. Use bottled, distilled water and change daily Be aware of reduced alertness and do not drive or operate heavy machinery if experiencing this or drowsiness.  Exercise encouraged, as tolerated. Healthy weight management discussed.  Avoid or decrease alcohol consumption and medications that make you more sleepy, if possible. Notify if persistent daytime sleepiness occurs even with consistent use of PAP therapy.  Change CPAP supplies... Every month Mask cushions and/or nasal pillows CPAP machine filters Every 3 months Mask frame (not including the headgear) CPAP tubing Every 6 months Mask headgear Chin strap (if applicable) Humidifier water tub   Call us  if you have any further issues with your breathing so we can get you set up for a pulmonary consult   Follow up 3/13 at 1 pm with Tammy Parrett via virtual visit to check on CPAP usage, or sooner, if needed

## 2024-03-16 NOTE — Progress Notes (Signed)
 "  @Patient  ID: Lori Walsh, female    DOB: 10/02/1955, 69 y.o.   MRN: [id number]  Chief Complaint  Patient presents with   Sleep Apnea    Adjustment have worked well for her  Lori Walsh on cruise and was sick with pna didn't get to wear it for a few days but have started back     Referring provider: Donzetta Rollene BRAVO, MD  Virtual Visit via Video Note  I connected with Lori Walsh on 03/16/2024 at  1:30 PM EST by a video enabled telemedicine application and verified that I am speaking with the correct person using two identifiers.  Location: Patient: Home Provider: Office   I discussed the limitations of evaluation and management by telemedicine and the availability of in person appointments. The patient expressed understanding and agreed to proceed.  History of Present Illness: 69 year old female, never smoker followed for severe OSA on CPAP. Past medical history significant for HTN, DM, depression, cardiac murmur.  TEST/EVENTS:  12/01/2023 HST: AHI 32/h  11/22/2023: OV with Malachy NP Lori Walsh is a 69 year old female who presents for a sleep consult due to loud snoring and gasping during sleep. She experiences loud snoring and gasping during sleep, as noted by her family. She does not wake up choking or gasping. She engages in sleep talking but not sleep walking. Daytime fatigue is prominent, with frequent naps. No morning headaches, drowsy driving, or falling asleep while driving. A sleep study was conducted approximately 20 years ago, but she has never been on CPAP therapy. She perceives her symptoms have worsened over time. Her husband's sleep issues and her's have lead them to sleep in separate rooms. Her children have expressed concern about her snoring, particularly during a recent beach trip. She has attempted to use a SnoreGuard, which she feels helps, but she cannot keep it in her mouth all night. She has not used any sleep medications consistently but has taken  Nyquil recently due to post-COVID drainage. She previously used gabapentin  for a hip issue, which she has since discontinued after chiropractic relief. 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 She's retired now. She struggles with weight loss. She does not want to do any medications at this time. She consumes wine occasionally and drinks two cups of coffee in the morning.  She has a history of terrible tonsils as a child, which were never removed. She goes to bed around [phone] pm. Falls asleep typically within 45 min. Wakes 3 times a night. Gets up around 8 am. No significant weight change in last few years.  [facility] 3  02/15/2024: OV with Cristabel Bicknell NP. Lori Walsh is a 69 year old female with sleep apnea who presents with issues related to CPAP therapy. She is experiencing air leaks from her CPAP machine, which are causing her to wake up at night. These disruptions are affecting her sleep quality and leading to increased fatigue. She has been using the CPAP machine for less than thirty days. Initially, she felt significantly more rested after the first week of use. She has a mask fitting scheduled for later today. She does feel like her sleep is more restful with it than without it. No drowsy driving.    03/16/2024: Today - follow up Discussed the use of AI scribe software for clinical note transcription with the patient, who gave verbal consent to proceed.  History  of Present Illness  Lori Walsh is a 69 year old female who presents for follow-up regarding CPAP compliance.  She recently experienced pneumonia following a bout with the flu. She consulted her primary care provider and reports feeling better. She was treated with antiviral and antibiotics. She is not 100% back to normal but feels she is getting there. No fevers, issues with her breathing, chills, hemoptysis. Cough is  improving. She does not have a history of lung disease. Due to this, she was unable to wear her CPAP for a few weeks.   Otherwise, she was feeling better with the adjustments we made at her last visit to her pressures. She was wearing it consistently. She felt she was sleeping better with it. Energy improved. She is back to using her CPAP again.   No drowsy driving. No issues with mask fit.     Allergies  Allergen Reactions   Strawberry Extract Itching   Crestor [Rosuvastatin] Other (See Comments)    MUSCLE ACHES    Lipitor [Atorvastatin] Other (See Comments)    MUSCLE ACHES   Zocor [Simvastatin] Other (See Comments)    MUSCLE ACHES     Immunization History  Administered Date(s) Administered   INFLUENZA, HIGH DOSE SEASONAL PF 01/02/2024   Influenza Split 12/07/2018   Influenza-Unspecified 12/26/2018   PFIZER(Purple Top)SARS-COV-2 Vaccination 04/04/2019, 04/25/2019   Pfizer(Comirnaty)Fall Seasonal Vaccine 12 years and older 01/25/2024   Tdap 01/25/2024   Zoster, Live 08/04/2018    Past Medical History:  Diagnosis Date   Depression    Fibroadenoma of right breast    GERD (gastroesophageal reflux disease)    History of gestational diabetes    History of nuclear stress test    04-16-2014  normal w/ no ischemia , ef 82% (in epic)   Hyperlipidemia    Hypertension    Left wrist fracture    Pre-diabetes    Thyroid  nodule    bilateral --- previous bx , benign/   last ultrasound in epic 08-19-2017   TMJ syndrome     Tobacco History: Social History   Tobacco Use  Smoking Status Never  Smokeless Tobacco Never   Counseling given: Not Answered   Outpatient Medications Prior to Visit  Medication Sig Dispense Refill   Accu-Chek Softclix Lancets lancets Use as directed once daily 100 each 4   acyclovir  ointment (ZOVIRAX ) 5 % Apply 1 Application topically every 4 (four) hours as needed. 30 g 2   aspirin EC 81 MG tablet Take 81 mg by mouth at bedtime.      Blood Glucose  Monitoring Suppl (ACCU-CHEK GUIDE) w/Device KIT Use as directed to check glucose once daily 1 kit 0   calcium carbonate (TUMS - DOSED IN MG ELEMENTAL CALCIUM) 500 MG chewable tablet Chew 1 tablet by mouth as needed for indigestion or heartburn.     ezetimibe  (ZETIA ) 10 MG tablet Take 1 tablet (10 mg total) by mouth daily. 90 tablet 3   fenofibrate  160 MG tablet Take 1 tablet (160 mg total) by mouth daily. 90 tablet 3   FLUoxetine  (PROZAC ) 20 MG capsule Take 1 capsule (20 mg total) by mouth daily. Take with your 40 mg daily capsule for a total of 60 mg daily. 30 capsule 1   FLUoxetine  (PROZAC ) 40 MG capsule Take 1 capsule (40 mg total) by mouth daily. 90 capsule 3   fluticasone  (FLONASE ) 50 MCG/ACT nasal spray Place 2 sprays into both nostrils 2 (two) times daily. 15.8 g 0  glucose blood (ACCU-CHEK GUIDE) test strip CHeck glucose once daily 100 strip 4   hydrOXYzine  (ATARAX ) 10 MG tablet Take 1 tablet (10 mg total) by mouth 3 (three) times daily as needed. 90 tablet 3   lisinopril  (ZESTRIL ) 40 MG tablet Take 1 tablet (40 mg total) by mouth daily. 90 tablet 3   Multiple Vitamin (MULTIVITAMIN) tablet Take 1 tablet by mouth daily.     trolamine salicylate (ASPERCREME) 10 % cream Apply 1 application topically as needed for muscle pain (hip).     azithromycin  (ZITHROMAX ) 250 MG tablet Take 2 tablets on day one, followed by 1 tablet daily. (Patient not taking: Reported on 03/16/2024) 6 tablet 0   No facility-administered medications prior to visit.     Review of Systems: As above     Physical Exam:  Patient is well-developed, well-nourished in no acute distress.  Resting comfortably at home.  No labored breathing.  Speech is clear and coherent with logical content.  Patient is alert and oriented at baseline.    Assessment & Plan:   OSA on CPAP Severe OSA, on CPAP. Suboptimal compliance due to recent illness. Since resumed therapy. Improved tolerance with pressure changes from 5-20 to 5-12  cmH2O. Reduced leaks. Receives benefit from use. Encouraged to work on increasing usage and notify of any further issues. Aware of proper care/use of device. Understands risks of untreated severe OSA. Healthy weight management encouraged. Safe driving practices reviewed.  Patient Instructions  Continue to increase use of CPAP every night, minimum of 4-6 hours a night.  Change equipment as directed. Wash your tubing with warm soap and water daily, hang to dry. Wash humidifier portion weekly. Use bottled, distilled water and change daily Be aware of reduced alertness and do not drive or operate heavy machinery if experiencing this or drowsiness.  Exercise encouraged, as tolerated. Healthy weight management discussed.  Avoid or decrease alcohol consumption and medications that make you more sleepy, if possible. Notify if persistent daytime sleepiness occurs even with consistent use of PAP therapy.  Change CPAP supplies... Every month Mask cushions and/or nasal pillows CPAP machine filters Every 3 months Mask frame (not including the headgear) CPAP tubing Every 6 months Mask headgear Chin strap (if applicable) Humidifier water tub   Call us  if you have any further issues with your breathing so we can get you set up for a pulmonary consult   Follow up 3/13 at 1 pm with Tammy Parrett via virtual visit to check on CPAP usage, or sooner, if needed    CAP (community acquired pneumonia) Influenza A pna. Clinically improved. No prior hx of lung disease. Following with PCP. Advised to call us  to schedule pulmonary consult if symptoms fail to improve or worsen    I discussed the assessment and treatment plan with the patient. The patient was provided an opportunity to ask questions and all were answered. The patient agreed with the plan and demonstrated an understanding of the instructions.   The patient was advised to call back or seek an in-person evaluation if the symptoms worsen or if the  condition fails to improve as anticipated.  I provided 25 minutes of non-face-to-face time during this encounter.  Comer LULLA Rouleau, NP 03/16/2024  Pt aware and understands NP's role.   "

## 2024-03-16 NOTE — Assessment & Plan Note (Signed)
 Influenza A pna. Clinically improved. No prior hx of lung disease. Following with PCP. Advised to call us  to schedule pulmonary consult if symptoms fail to improve or worsen

## 2024-03-16 NOTE — Patient Instructions (Addendum)
 Continue to increase use of CPAP every night, minimum of 4-6 hours a night.  Change equipment as directed. Wash your tubing with warm soap and water daily, hang to dry. Wash humidifier portion weekly. Use bottled, distilled water and change daily Be aware of reduced alertness and do not drive or operate heavy machinery if experiencing this or drowsiness.  Exercise encouraged, as tolerated. Healthy weight management discussed.  Avoid or decrease alcohol consumption and medications that make you more sleepy, if possible. Notify if persistent daytime sleepiness occurs even with consistent use of PAP therapy.  Change CPAP supplies... Every month Mask cushions and/or nasal pillows CPAP machine filters Every 3 months Mask frame (not including the headgear) CPAP tubing Every 6 months Mask headgear Chin strap (if applicable) Humidifier water tub   Call us  if you have any further issues with your breathing so we can get you set up for a pulmonary consult   Follow up 3/13 at 1 pm with Lori Walsh via virtual visit to check on CPAP usage, or sooner, if needed

## 2024-04-16 ENCOUNTER — Ambulatory Visit: Admitting: Podiatry

## 2024-05-18 ENCOUNTER — Ambulatory Visit: Admitting: Adult Health
